# Patient Record
Sex: Female | Born: 1945
Health system: Southern US, Community
[De-identification: ages and names within clinical notes are randomized; demographics above are authoritative.]

## PROBLEM LIST (undated history)

## (undated) DIAGNOSIS — I10 Essential (primary) hypertension: Secondary | ICD-10-CM

## (undated) DIAGNOSIS — T7840XA Allergy, unspecified, initial encounter: Secondary | ICD-10-CM

## (undated) DIAGNOSIS — M1990 Unspecified osteoarthritis, unspecified site: Secondary | ICD-10-CM

## (undated) HISTORY — PX: BREAST LUMPECTOMY: SHX2

## (undated) HISTORY — PX: TUBAL LIGATION: SHX77

## (undated) HISTORY — PX: HERNIA REPAIR: SHX51

## (undated) HISTORY — DX: Essential (primary) hypertension: I10

## (undated) HISTORY — DX: Allergy, unspecified, initial encounter: T78.40XA

## (undated) HISTORY — DX: Unspecified osteoarthritis, unspecified site: M19.90

---

## 1996-07-07 HISTORY — PX: OOPHORECTOMY: SHX86

## 1997-10-23 ENCOUNTER — Ambulatory Visit (HOSPITAL_COMMUNITY): Admission: RE | Admit: 1997-10-23 | Discharge: 1997-10-23 | Payer: Self-pay | Admitting: Family Medicine

## 1999-03-17 ENCOUNTER — Other Ambulatory Visit: Admission: RE | Admit: 1999-03-17 | Discharge: 1999-03-17 | Payer: Self-pay | Admitting: Obstetrics and Gynecology

## 1999-06-19 ENCOUNTER — Other Ambulatory Visit: Admission: RE | Admit: 1999-06-19 | Discharge: 1999-06-19 | Payer: Self-pay | Admitting: Obstetrics and Gynecology

## 1999-06-25 ENCOUNTER — Encounter: Payer: Self-pay | Admitting: Obstetrics and Gynecology

## 1999-06-25 ENCOUNTER — Ambulatory Visit (HOSPITAL_COMMUNITY): Admission: RE | Admit: 1999-06-25 | Discharge: 1999-06-25 | Payer: Self-pay | Admitting: Obstetrics and Gynecology

## 1999-08-20 ENCOUNTER — Encounter (INDEPENDENT_AMBULATORY_CARE_PROVIDER_SITE_OTHER): Payer: Self-pay

## 1999-08-20 ENCOUNTER — Encounter (INDEPENDENT_AMBULATORY_CARE_PROVIDER_SITE_OTHER): Payer: Self-pay | Admitting: Specialist

## 1999-08-20 ENCOUNTER — Ambulatory Visit (HOSPITAL_COMMUNITY): Admission: RE | Admit: 1999-08-20 | Discharge: 1999-08-20 | Payer: Self-pay | Admitting: Obstetrics and Gynecology

## 2000-06-19 ENCOUNTER — Encounter (HOSPITAL_BASED_OUTPATIENT_CLINIC_OR_DEPARTMENT_OTHER): Payer: Self-pay | Admitting: General Surgery

## 2000-06-23 ENCOUNTER — Ambulatory Visit (HOSPITAL_COMMUNITY): Admission: RE | Admit: 2000-06-23 | Discharge: 2000-06-23 | Payer: Self-pay | Admitting: General Surgery

## 2000-06-23 ENCOUNTER — Encounter (INDEPENDENT_AMBULATORY_CARE_PROVIDER_SITE_OTHER): Payer: Self-pay | Admitting: *Deleted

## 2002-04-20 ENCOUNTER — Other Ambulatory Visit: Admission: RE | Admit: 2002-04-20 | Discharge: 2002-04-20 | Payer: Self-pay | Admitting: Obstetrics and Gynecology

## 2003-07-04 ENCOUNTER — Other Ambulatory Visit: Admission: RE | Admit: 2003-07-04 | Discharge: 2003-07-04 | Payer: Self-pay | Admitting: Obstetrics and Gynecology

## 2005-09-16 ENCOUNTER — Other Ambulatory Visit: Admission: RE | Admit: 2005-09-16 | Discharge: 2005-09-16 | Payer: Self-pay | Admitting: Obstetrics and Gynecology

## 2006-05-15 ENCOUNTER — Encounter: Admission: RE | Admit: 2006-05-15 | Discharge: 2006-05-15 | Payer: Self-pay | Admitting: Orthopedic Surgery

## 2006-10-06 ENCOUNTER — Encounter: Admission: RE | Admit: 2006-10-06 | Discharge: 2006-10-06 | Payer: Self-pay | Admitting: Obstetrics and Gynecology

## 2007-07-12 ENCOUNTER — Encounter: Admission: RE | Admit: 2007-07-12 | Discharge: 2007-07-12 | Payer: Self-pay | Admitting: Orthopedic Surgery

## 2007-12-09 ENCOUNTER — Ambulatory Visit (HOSPITAL_COMMUNITY): Admission: RE | Admit: 2007-12-09 | Discharge: 2007-12-09 | Payer: Self-pay | Admitting: Dermatology

## 2008-09-06 ENCOUNTER — Ambulatory Visit: Payer: Self-pay | Admitting: Gastroenterology

## 2008-09-22 ENCOUNTER — Ambulatory Visit: Payer: Self-pay | Admitting: Gastroenterology

## 2009-09-28 ENCOUNTER — Encounter: Admission: RE | Admit: 2009-09-28 | Discharge: 2009-09-28 | Payer: Self-pay | Admitting: Orthopedic Surgery

## 2009-10-24 ENCOUNTER — Encounter: Admission: RE | Admit: 2009-10-24 | Discharge: 2009-10-24 | Payer: Self-pay | Admitting: Otolaryngology

## 2010-07-28 ENCOUNTER — Encounter: Payer: Self-pay | Admitting: Family Medicine

## 2010-10-08 ENCOUNTER — Ambulatory Visit
Admission: RE | Admit: 2010-10-08 | Discharge: 2010-10-08 | Disposition: A | Discharge status: Home / Self Care | Source: Ambulatory Visit | Attending: Orthopedic Surgery | Admitting: Orthopedic Surgery

## 2010-10-08 ENCOUNTER — Other Ambulatory Visit: Payer: Self-pay | Admitting: Orthopedic Surgery

## 2010-10-08 DIAGNOSIS — M542 Cervicalgia: Secondary | ICD-10-CM

## 2010-10-16 ENCOUNTER — Other Ambulatory Visit: Payer: Self-pay | Admitting: Orthopedic Surgery

## 2010-10-16 DIAGNOSIS — M5412 Radiculopathy, cervical region: Secondary | ICD-10-CM

## 2010-10-22 ENCOUNTER — Ambulatory Visit
Admission: RE | Admit: 2010-10-22 | Discharge: 2010-10-22 | Disposition: A | Discharge status: Home / Self Care | Source: Ambulatory Visit | Attending: Orthopedic Surgery | Admitting: Orthopedic Surgery

## 2010-10-22 DIAGNOSIS — M5412 Radiculopathy, cervical region: Secondary | ICD-10-CM

## 2010-11-22 NOTE — Op Note (Signed)
[facility]  Patient:    Jill Lawrence, Jill Lawrence                     MRN: [id number] Proc. Date: 08/20/99 Adm. Date:  [id number] Attending:  Dierdre Forth Pearline                           Operative Report  PREOPERATIVE DIAGNOSES:       Uterine fibroids; left adnexal mass.  POSTOPERATIVE DIAGNOSES:      Uterine fibroids; left ovarian mass, probable cystic teratoma.  OPERATION:                    Open laparoscopy and left oophorectomy.  SURGEON:                      Vanessa P. Pennie Rushing, M.D.  FIRST ASSISTANT:              Janine Limbo, M.D.  ANESTHESIA:                   General orotracheal.  ESTIMATED BLOOD LOSS:         Less than 50 cc.  COMPLICATIONS:                None.  FINDINGS:                     The uterus was upper-limits-normal-size with a 1-cm uterine fibroid on the anterior fundus and a 3-cm uterine fibroid on the posterior fundus which were both subserosal.  The tubes were status post tubal interruption for sterilization.  The right ovary appeared normal.  The left ovary was enlarged to 5 x 4 cm and appeared primarily cystic.  There were no excrescences or other  implants of apparent disease.  DESCRIPTION OF PROCEDURE:     The patient was taken to the operating room after  appropriate identification and placed on the operating table.  After the attainment of adequate general anesthesia, she was placed in the modified lithotomy position. The abdomen, perineum and vagina were prepped with multiple layers of Betadine nd a Foley catheter placed into the bladder under sterile conditions and connected to straight drainage.  A Hulka tenaculum was placed on the anterior cervix.  The abdomen was draped as a sterile field.  The subumbilical and suprapubic areas were infiltrated with a total of 10 cc of 0.25% Marcaine.  A subumbilical incision was made in the area of infiltration and the Veress cannula placed through  that incision into the peritoneal cavity.  A pneumoperitoneum was created with 3 L of CO2.  The laparoscopic trocar was placed through that incision after removal of the Veress cannula and a laparoscope placed through the trocar sleeve.  Suprapubic incisions were made to the right and left of midline in the areas where local infiltration had been achieved.  Laparoscopic probe trocars were placed through  those incisions into the peritoneal cavity under direct visualization.  The above findings were noted and documented and the left ovary grasped by the uteroovarian ligament.  The left ureter was identified, as was the left infundibulopelvic ligament.  Three successive 0 Vicryl endoloops were placed into the peritoneal cavity via the probe trocar sleeve and tied down on the infundibulopelvic and uteroovarian ligaments.  After tying down these three sutures, the ovary was excised and placed in the anterior cul-de-sac.  Hemostasis was noted  to be adequate immediately and after release of the pneumoperitoneum.  The ovary was then removed from the peritoneal cavity with the aid of an Endobag.  The subumbilical incision had to be extended in order to allow removal of the ovary from that site in the  Endobag.  Once the ovary was removed, the laparoscope was replaced and the pelvis again evaluated and hemostasis still noted to be adequate.  Copious irrigation as carried out with the Nezhat cannula and all instruments were removed from the peritoneal cavity under direct visualization as the CO2 was allowed to escape.  Fascial sutures were placed in the subumbilical incision of 0 Vicryl in a figure-of-eight fashion.  The subcutaneous tissue was reapproximated with interrupted sutures and a subcuticular suture of 3-0 Vicryl used to close the skin incision.  The suprapubic skin incisions were closed with subcuticular sutures f 3-0 Vicryl.  Prior to removal of the ovary, copious irrigation  of the pelvis was carried out and pelvis washings obtained via the Nezhat cannula.  These were sent under separate pathologic specimen.  Once the incisions had been closed, sterile dressings were applied and the Hulka tenaculum removed.  The patient was then awakened from general anesthesia and taken to the recovery room in satisfactory  condition, having tolerated the procedure well, with sponge and instrument counts correct.  SPECIMENS TO PATHOLOGY:       Left ovary and peritoneal washings. DD:  08/20/99 TD:  08/20/99 Job: [id number]

## 2010-11-22 NOTE — Op Note (Signed)
[place]  Patient:    Jill Lawrence, Jill Lawrence                     MRN: [id number] Proc. Date: 06/23/00 Adm. Date:  [id number] Disc. Date: [id number] Attending:  Sonda Primes CC:         Sharyn Dross., M.D.                           Operative Report  PREOPERATIVE DIAGNOSIS:  Ventral hernia.  POSTOPERATIVE DIAGNOSIS:  Ventral hernia.  PROCEDURE:  Repair of ventral hernia with mesh.  SURGEON:  Mardene Celeste. Lurene Shadow, M.D.  ASSISTANT:  Nurse.  ANESTHESIA:  General.  INDICATIONS:  This patient is a 65 year old woman presenting with severe abdominal pain due to an incarcerating ventral hernia, which was located just inferior to the umbilicus.  She was prepared now and brought to the operating room for repair.  PROCEDURE:  Following the induction of satisfactory anesthesia with the patient positioned supinely, the abdomen was prepped and draped to be included in a sterile operative field.  A midline incision was made through the umbilicus carrying the incision down below the umbilicus for approximately 1.5 inches.  Deepened through the skin and subcutaneous tissues and dissection carried down to the hernial sac.  Sac was dissected free from the surrounding tissues and opened.  On palpation on the inside, there was another small hernia towards superiorly cephalad towards the umbilicus.  Hernia sac was removed.  A mesh plug was placed in the defect and sewn in place with a running suture of #1 Novofil.  Over this, a patch was fashioned so as to cover both the superior and inferior defects and this was also sewn with a running suture of #1 Novofil.  Wound was then thoroughly irrigated with saline.  All areas of dissection were checked for hemostasis and noted to be dry.  Sponge, instrument and sharp counts were verified.  Subcutaneous tissues were closed with running 2-0 Vicryl suture, skin closed with running 4-0 Vicryl and then reinforced with  Steri-Strips.  Sterile dressings applied.  Anesthetic reversed and the patient removed from the operating room to the recovery room in stable condition having tolerated the procedure well. DD:  06/23/00 TD:  06/24/00 Job: [id number]

## 2011-09-06 IMAGING — CR DG KNEE 1-2V*R*
2 series · 2 of 2 positions shown · non-contrast
Comparison: Right knee films of 07/12/2007

CLINICAL DATA: Knee pain, left greater than right

RIGHT KNEE - 1-2 VIEW

[w knee ap right]
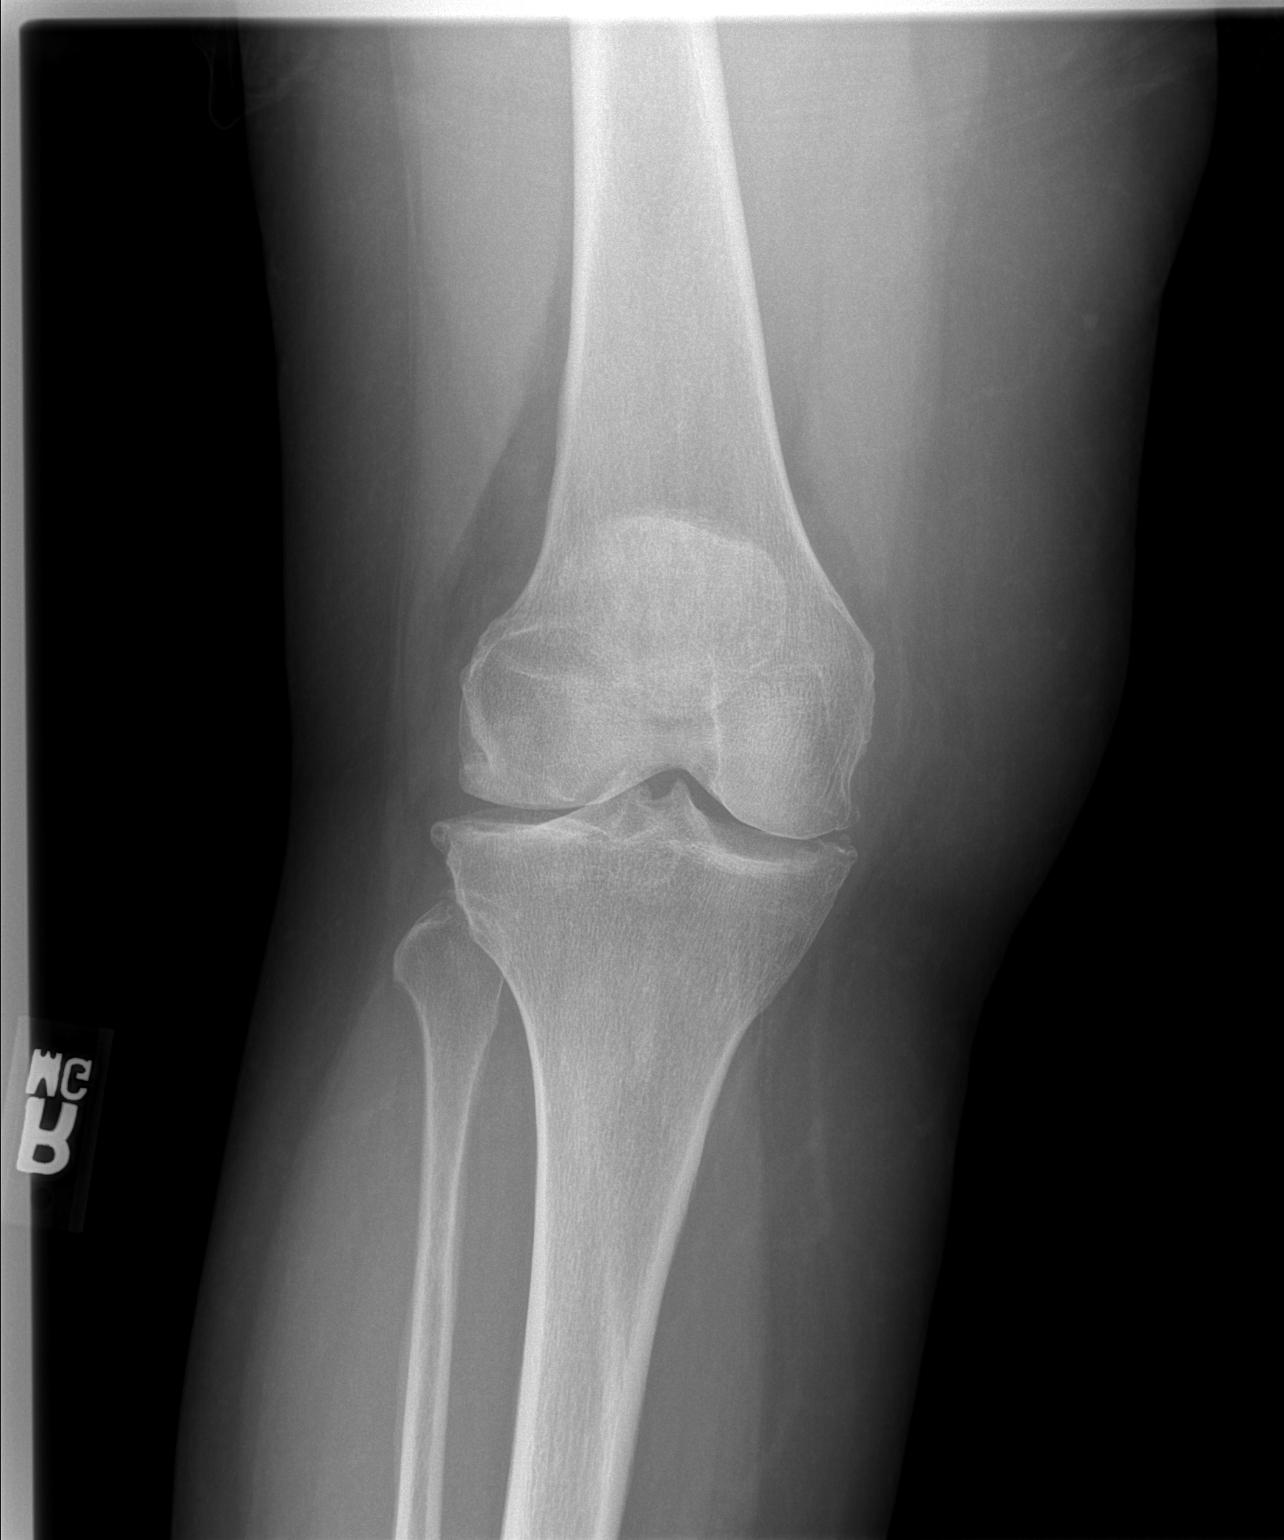

[w knee lat. right]
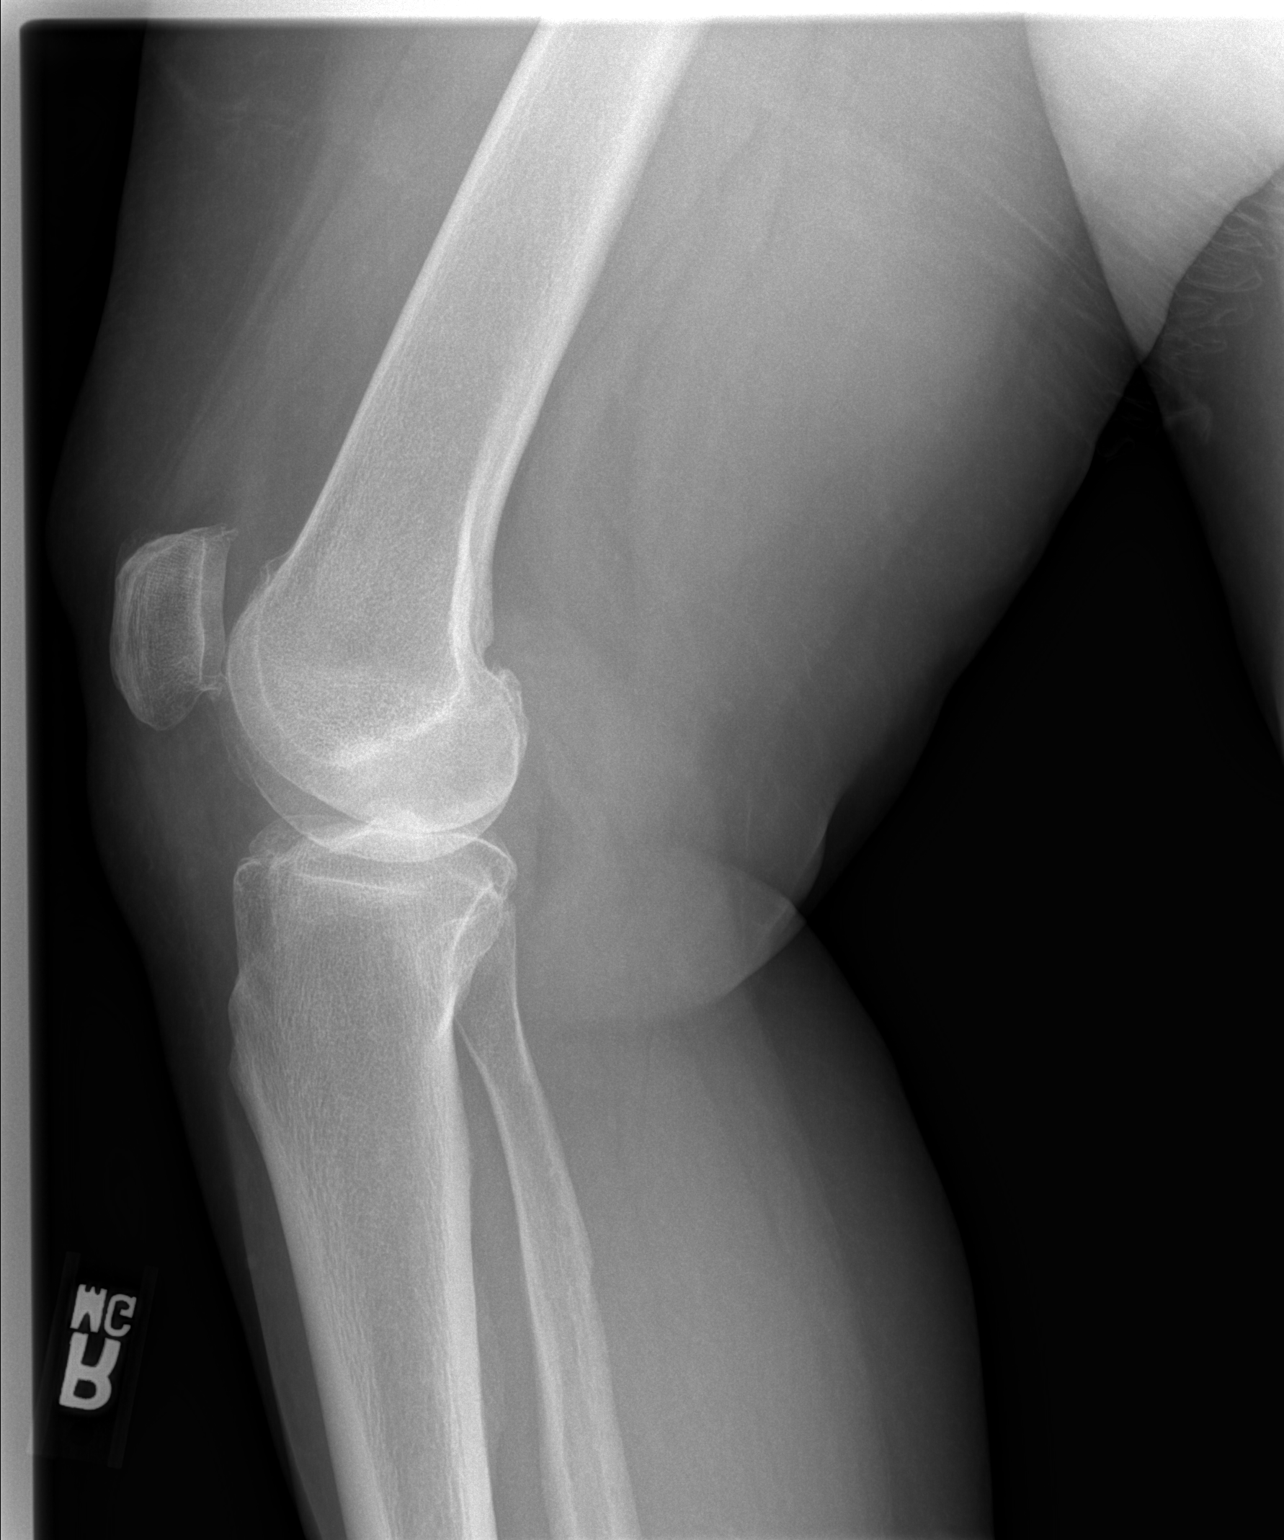

[2 of 2 positions shown; findings below may reference images not displayed]

FINDINGS: Mild degenerative joint disease is noted diffusely.  No
fracture is seen and no joint effusion is noted.
IMPRESSION: Mild diffuse degenerative joint disease.

## 2011-09-06 IMAGING — CR DG KNEE 1-2V*L*
2 series · 2 of 2 positions shown · non-contrast
Comparison: Left knee films of 07/12/2007

CLINICAL DATA: Knee pain, left greater than right

LEFT KNEE - 1-2 VIEW

[w knee ap left]
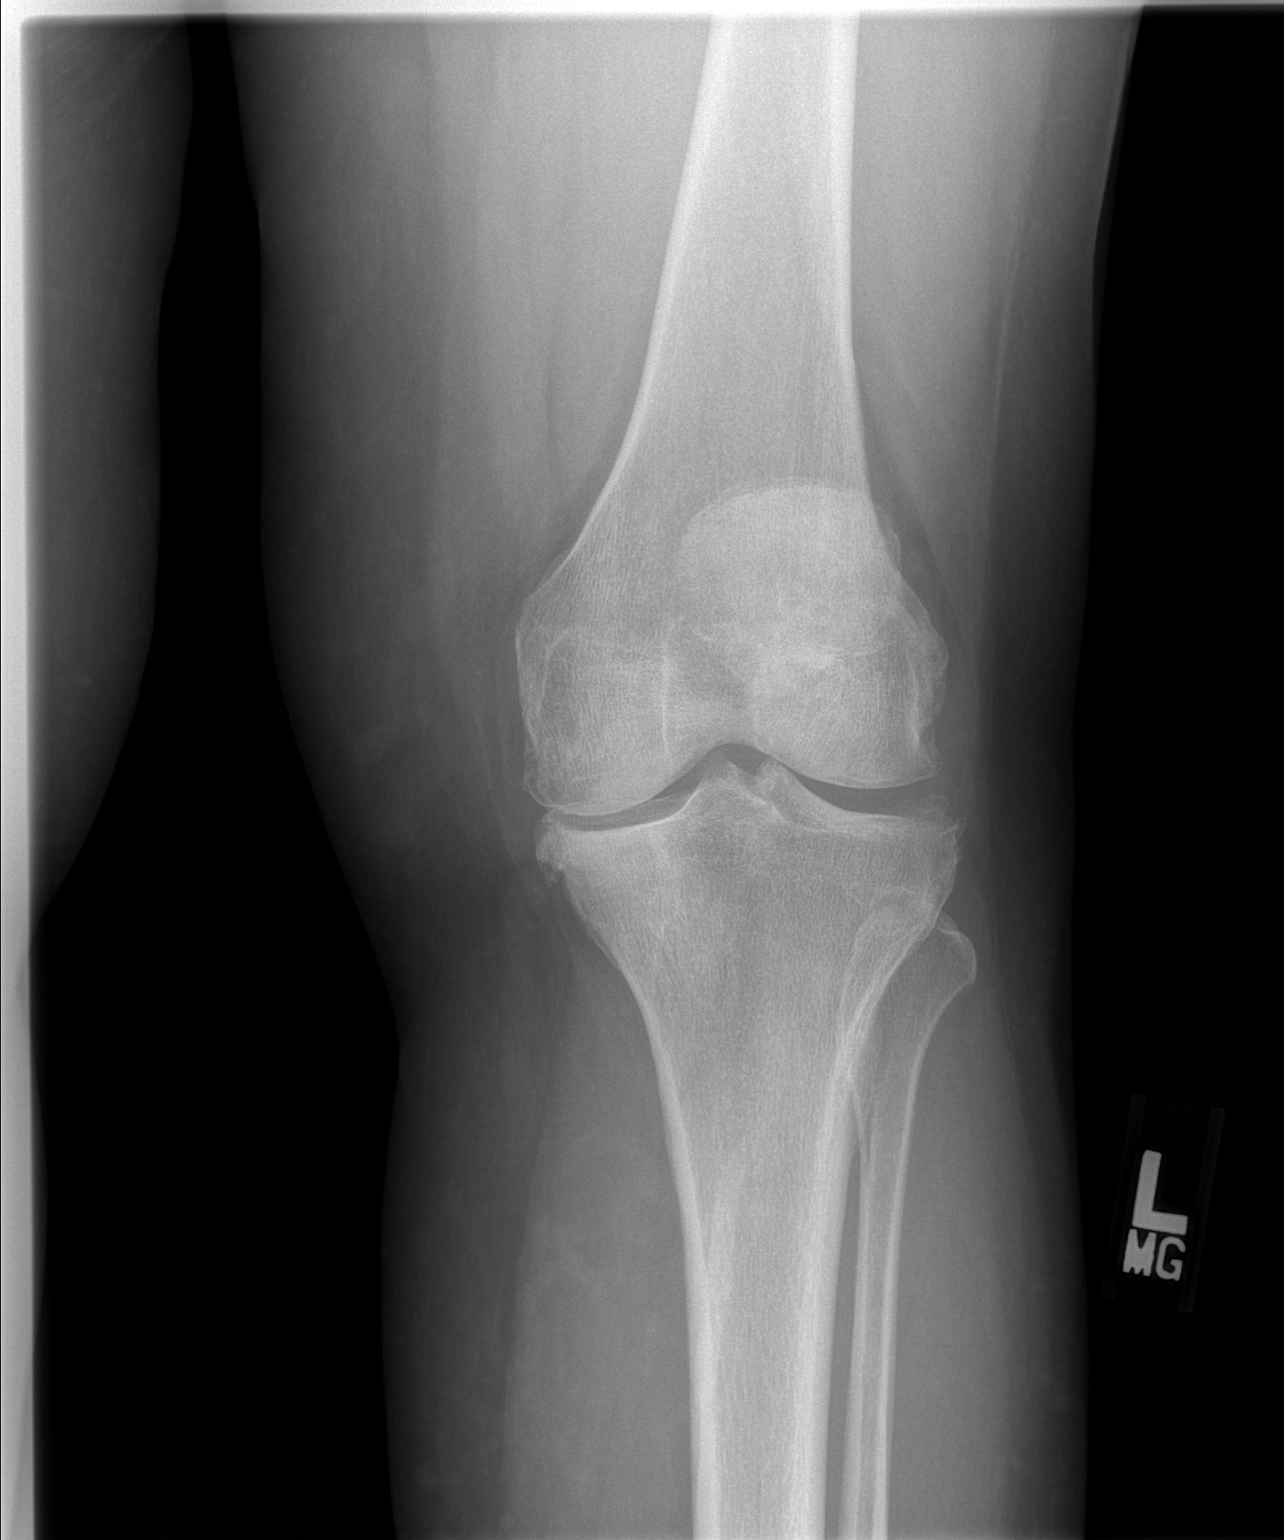

[w knee lat. left]
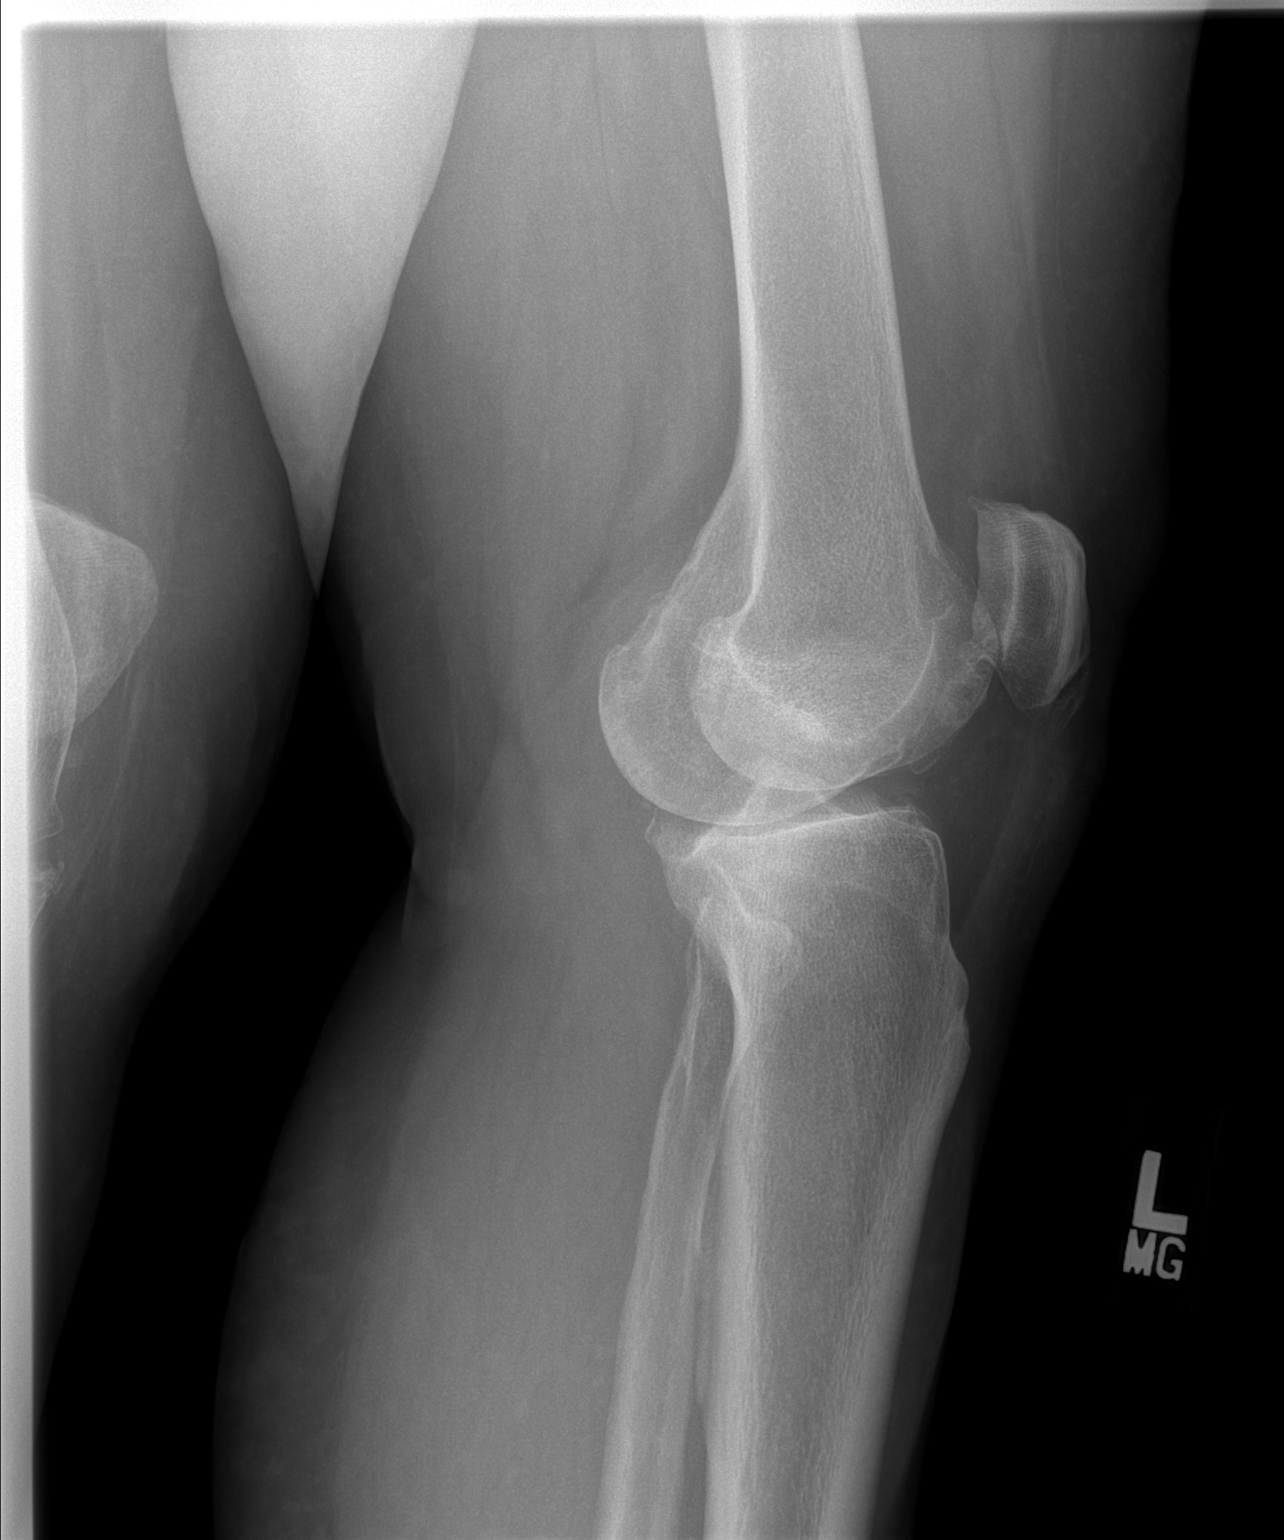

[2 of 2 positions shown; findings below may reference images not displayed]

FINDINGS: Degenerative joint disease primarily medially appears
relatively stable with loss of joint space, sclerosis, and
spurring.  No fracture is seen and no joint space effusion is
noted.
IMPRESSION: Relatively stable degenerative joint disease primarily involving
the medial compartment.

## 2011-10-14 ENCOUNTER — Ambulatory Visit: Payer: Self-pay | Admitting: Obstetrics and Gynecology

## 2011-11-18 ENCOUNTER — Encounter: Payer: Self-pay | Admitting: Obstetrics and Gynecology

## 2011-11-18 ENCOUNTER — Ambulatory Visit

## 2011-11-18 ENCOUNTER — Ambulatory Visit (INDEPENDENT_AMBULATORY_CARE_PROVIDER_SITE_OTHER): Admitting: Obstetrics and Gynecology

## 2011-11-18 VITALS — BP 122/78 | Ht 64.0 in | Wt 177.0 lb

## 2011-11-18 DIAGNOSIS — M949 Disorder of cartilage, unspecified: Secondary | ICD-10-CM

## 2011-11-18 DIAGNOSIS — N951 Menopausal and female climacteric states: Secondary | ICD-10-CM

## 2011-11-18 DIAGNOSIS — Z124 Encounter for screening for malignant neoplasm of cervix: Secondary | ICD-10-CM

## 2011-11-18 DIAGNOSIS — Z13820 Encounter for screening for osteoporosis: Secondary | ICD-10-CM

## 2011-11-18 DIAGNOSIS — I10 Essential (primary) hypertension: Secondary | ICD-10-CM

## 2011-11-18 DIAGNOSIS — M8580 Other specified disorders of bone density and structure, unspecified site: Secondary | ICD-10-CM

## 2011-11-18 NOTE — Progress Notes (Signed)
The patient is not taking hormone replacement therapy The patient  is taking a Calcium supplement. Post-menopausal bleeding:no  Last Pap: was normal March  2010 Last mammogram: was normal December  2012 Last DEXA scan : T= -0.07 September 2008 Last colonoscopy:was normal March 2010  Urinary symptoms: none Normal bowel movements: Yes Reports abuse at home: No:   Subjective:    Jill Lawrence is a 66 y.o. female . who presents for annual exam.  The patient has no complaints today. Her husband is being treated for stage 4 colon cancer with alternative therapy in the [place]. The following portions of the patient's history were reviewed and updated as appropriate: allergies, current medications, past family history, past medical history, past social history, past surgical history and problem list.  Review of Systems Pertinent items are noted in HPI. Gastrointestinal:No change in bowel habits, no abdominal pain, no rectal bleeding Genitourinary:negative for dysuria, frequency, hematuria, nocturia and urinary incontinence    Objective:     BP 122/78  Ht 5\' 4"  (1.626 m)  Wt 177 lb (80.287 kg)  BMI 30.38 kg/m2  Weight:  Wt Readings from Last 1 Encounters:  11/18/11 177 lb (80.287 kg)     BMI: Body mass index is 30.38 kg/(m^2). General Appearance: Alert, appropriate appearance for age. No acute distress HEENT: Grossly normal Neck / Thyroid: Supple, no masses, nodes or enlargement Lungs: clear to auscultation bilaterally Back: No CVA tenderness Breast Exam: No masses or nodes.No dimpling, nipple retraction or discharge. Cardiovascular: Regular rate and rhythm. S1, S2, no murmur Gastrointestinal: Soft, non-tender, no masses or organomegaly Pelvic Exam: External genitalia: normal general appearance Vaginal: normal mucosa without prolapse or lesions Cervix: normal appearance Adnexa: non palpable Uterus: upper limits normal size though the examination is slightly compromised by  patient's habitus Rectovaginal: normal rectal, no masses Lymphatic Exam: Non-palpable nodes in neck, clavicular, axillary, or inguinal regions Skin: no rash or abnormalities Neurologic: Normal gait and speech, no tremor  Psychiatric: Alert and oriented, appropriate affect.    Urinalysis:Not done      Assessment:    Menopause No symptoms Status post left oophorectomy for ovarian dermoid   Plan:    Pap with high-risk HPV  Bone density Mammogram Follow-up:  for annual exam

## 2011-11-19 LAB — VITAMIN D 25 HYDROXY (VIT D DEFICIENCY, FRACTURES): Vit D, 25-Hydroxy: 59 ng/mL (ref 30–89)

## 2011-11-21 LAB — PAP IG AND HPV HIGH-RISK

## 2012-05-17 DIAGNOSIS — M659 Synovitis and tenosynovitis, unspecified: Secondary | ICD-10-CM | POA: Diagnosis not present

## 2012-05-17 DIAGNOSIS — M1710 Unilateral primary osteoarthritis, unspecified knee: Secondary | ICD-10-CM | POA: Diagnosis not present

## 2012-06-09 DIAGNOSIS — E119 Type 2 diabetes mellitus without complications: Secondary | ICD-10-CM | POA: Diagnosis not present

## 2012-06-09 DIAGNOSIS — E7800 Pure hypercholesterolemia, unspecified: Secondary | ICD-10-CM | POA: Diagnosis not present

## 2012-06-09 DIAGNOSIS — E781 Pure hyperglyceridemia: Secondary | ICD-10-CM | POA: Diagnosis not present

## 2012-06-09 DIAGNOSIS — Z23 Encounter for immunization: Secondary | ICD-10-CM | POA: Diagnosis not present

## 2012-06-09 DIAGNOSIS — I10 Essential (primary) hypertension: Secondary | ICD-10-CM | POA: Diagnosis not present

## 2012-07-21 DIAGNOSIS — Z1231 Encounter for screening mammogram for malignant neoplasm of breast: Secondary | ICD-10-CM | POA: Diagnosis not present

## 2012-07-23 DIAGNOSIS — H524 Presbyopia: Secondary | ICD-10-CM | POA: Diagnosis not present

## 2012-07-23 DIAGNOSIS — H04159 Secondary lacrimal gland atrophy, unspecified lacrimal gland: Secondary | ICD-10-CM | POA: Diagnosis not present

## 2012-07-30 DIAGNOSIS — Z09 Encounter for follow-up examination after completed treatment for conditions other than malignant neoplasm: Secondary | ICD-10-CM | POA: Diagnosis not present

## 2012-07-30 DIAGNOSIS — R928 Other abnormal and inconclusive findings on diagnostic imaging of breast: Secondary | ICD-10-CM | POA: Diagnosis not present

## 2012-08-04 ENCOUNTER — Encounter: Payer: Self-pay | Admitting: Obstetrics and Gynecology

## 2012-08-04 DIAGNOSIS — R921 Mammographic calcification found on diagnostic imaging of breast: Secondary | ICD-10-CM | POA: Insufficient documentation

## 2012-12-15 DIAGNOSIS — M5430 Sciatica, unspecified side: Secondary | ICD-10-CM | POA: Diagnosis not present

## 2012-12-29 DIAGNOSIS — M5430 Sciatica, unspecified side: Secondary | ICD-10-CM | POA: Diagnosis not present

## 2012-12-30 ENCOUNTER — Ambulatory Visit
Admission: RE | Admit: 2012-12-30 | Discharge: 2012-12-30 | Disposition: A | Discharge status: Home / Self Care | Payer: Medicare Other | Source: Ambulatory Visit | Attending: Orthopedic Surgery | Admitting: Orthopedic Surgery

## 2012-12-30 ENCOUNTER — Other Ambulatory Visit: Payer: Self-pay | Admitting: Orthopedic Surgery

## 2012-12-30 DIAGNOSIS — M47817 Spondylosis without myelopathy or radiculopathy, lumbosacral region: Secondary | ICD-10-CM | POA: Diagnosis not present

## 2012-12-30 DIAGNOSIS — M4120 Other idiopathic scoliosis, site unspecified: Secondary | ICD-10-CM | POA: Diagnosis not present

## 2012-12-30 DIAGNOSIS — M5430 Sciatica, unspecified side: Secondary | ICD-10-CM

## 2013-01-17 DIAGNOSIS — M5430 Sciatica, unspecified side: Secondary | ICD-10-CM | POA: Diagnosis not present

## 2013-01-17 DIAGNOSIS — M999 Biomechanical lesion, unspecified: Secondary | ICD-10-CM | POA: Diagnosis not present

## 2013-01-19 DIAGNOSIS — M999 Biomechanical lesion, unspecified: Secondary | ICD-10-CM | POA: Diagnosis not present

## 2013-01-19 DIAGNOSIS — M5430 Sciatica, unspecified side: Secondary | ICD-10-CM | POA: Diagnosis not present

## 2013-01-24 DIAGNOSIS — M5430 Sciatica, unspecified side: Secondary | ICD-10-CM | POA: Diagnosis not present

## 2013-01-24 DIAGNOSIS — M999 Biomechanical lesion, unspecified: Secondary | ICD-10-CM | POA: Diagnosis not present

## 2013-01-26 DIAGNOSIS — M5430 Sciatica, unspecified side: Secondary | ICD-10-CM | POA: Diagnosis not present

## 2013-01-26 DIAGNOSIS — M999 Biomechanical lesion, unspecified: Secondary | ICD-10-CM | POA: Diagnosis not present

## 2013-02-02 DIAGNOSIS — R928 Other abnormal and inconclusive findings on diagnostic imaging of breast: Secondary | ICD-10-CM | POA: Diagnosis not present

## 2013-02-03 DIAGNOSIS — M999 Biomechanical lesion, unspecified: Secondary | ICD-10-CM | POA: Diagnosis not present

## 2013-02-03 DIAGNOSIS — M5430 Sciatica, unspecified side: Secondary | ICD-10-CM | POA: Diagnosis not present

## 2013-02-07 DIAGNOSIS — M999 Biomechanical lesion, unspecified: Secondary | ICD-10-CM | POA: Diagnosis not present

## 2013-02-07 DIAGNOSIS — M5430 Sciatica, unspecified side: Secondary | ICD-10-CM | POA: Diagnosis not present

## 2013-02-09 DIAGNOSIS — M5430 Sciatica, unspecified side: Secondary | ICD-10-CM | POA: Diagnosis not present

## 2013-02-09 DIAGNOSIS — M999 Biomechanical lesion, unspecified: Secondary | ICD-10-CM | POA: Diagnosis not present

## 2013-02-14 DIAGNOSIS — M5430 Sciatica, unspecified side: Secondary | ICD-10-CM | POA: Diagnosis not present

## 2013-02-14 DIAGNOSIS — M999 Biomechanical lesion, unspecified: Secondary | ICD-10-CM | POA: Diagnosis not present

## 2013-03-29 ENCOUNTER — Other Ambulatory Visit: Payer: Self-pay | Admitting: Orthopedic Surgery

## 2013-03-29 DIAGNOSIS — M545 Low back pain: Secondary | ICD-10-CM

## 2013-03-29 DIAGNOSIS — M5430 Sciatica, unspecified side: Secondary | ICD-10-CM | POA: Diagnosis not present

## 2013-04-05 ENCOUNTER — Ambulatory Visit
Admission: RE | Admit: 2013-04-05 | Discharge: 2013-04-05 | Disposition: A | Discharge status: Home / Self Care | Payer: Medicare Other | Source: Ambulatory Visit | Attending: Orthopedic Surgery | Admitting: Orthopedic Surgery

## 2013-04-05 DIAGNOSIS — M5126 Other intervertebral disc displacement, lumbar region: Secondary | ICD-10-CM | POA: Diagnosis not present

## 2013-04-05 DIAGNOSIS — M545 Low back pain: Secondary | ICD-10-CM

## 2013-04-05 DIAGNOSIS — M48061 Spinal stenosis, lumbar region without neurogenic claudication: Secondary | ICD-10-CM | POA: Diagnosis not present

## 2013-04-13 DIAGNOSIS — M5126 Other intervertebral disc displacement, lumbar region: Secondary | ICD-10-CM | POA: Diagnosis not present

## 2013-04-27 ENCOUNTER — Other Ambulatory Visit: Payer: Self-pay | Admitting: Orthopedic Surgery

## 2013-04-27 DIAGNOSIS — M549 Dorsalgia, unspecified: Secondary | ICD-10-CM

## 2013-05-04 ENCOUNTER — Ambulatory Visit
Admission: RE | Admit: 2013-05-04 | Discharge: 2013-05-04 | Disposition: A | Discharge status: Home / Self Care | Payer: Medicare Other | Source: Ambulatory Visit | Attending: Orthopedic Surgery | Admitting: Orthopedic Surgery

## 2013-05-04 DIAGNOSIS — M549 Dorsalgia, unspecified: Secondary | ICD-10-CM

## 2013-05-04 DIAGNOSIS — M5126 Other intervertebral disc displacement, lumbar region: Secondary | ICD-10-CM | POA: Diagnosis not present

## 2013-05-04 DIAGNOSIS — M47817 Spondylosis without myelopathy or radiculopathy, lumbosacral region: Secondary | ICD-10-CM | POA: Diagnosis not present

## 2013-05-04 MED ORDER — IOHEXOL 180 MG/ML  SOLN
1.0000 mL | Freq: Once | INTRAMUSCULAR | Status: AC | PRN
  Administered 2013-05-04: 1 mL via EPIDURAL

## 2013-05-04 MED ORDER — METHYLPREDNISOLONE ACETATE 40 MG/ML INJ SUSP (RADIOLOG
120.0000 mg | Freq: Once | INTRAMUSCULAR | Status: AC
  Administered 2013-05-04: 120 mg via EPIDURAL

## 2013-05-16 DIAGNOSIS — M5430 Sciatica, unspecified side: Secondary | ICD-10-CM | POA: Diagnosis not present

## 2013-06-10 DIAGNOSIS — J209 Acute bronchitis, unspecified: Secondary | ICD-10-CM | POA: Diagnosis not present

## 2013-06-10 DIAGNOSIS — E119 Type 2 diabetes mellitus without complications: Secondary | ICD-10-CM | POA: Diagnosis not present

## 2013-06-10 DIAGNOSIS — E781 Pure hyperglyceridemia: Secondary | ICD-10-CM | POA: Diagnosis not present

## 2013-06-20 DIAGNOSIS — J069 Acute upper respiratory infection, unspecified: Secondary | ICD-10-CM | POA: Diagnosis not present

## 2013-07-27 DIAGNOSIS — H04159 Secondary lacrimal gland atrophy, unspecified lacrimal gland: Secondary | ICD-10-CM | POA: Diagnosis not present

## 2013-08-02 ENCOUNTER — Encounter: Payer: Self-pay | Admitting: Gastroenterology

## 2013-08-03 DIAGNOSIS — R928 Other abnormal and inconclusive findings on diagnostic imaging of breast: Secondary | ICD-10-CM | POA: Diagnosis not present

## 2013-11-09 DIAGNOSIS — E119 Type 2 diabetes mellitus without complications: Secondary | ICD-10-CM | POA: Diagnosis not present

## 2013-11-09 DIAGNOSIS — E781 Pure hyperglyceridemia: Secondary | ICD-10-CM | POA: Diagnosis not present

## 2013-11-09 DIAGNOSIS — I10 Essential (primary) hypertension: Secondary | ICD-10-CM | POA: Diagnosis not present

## 2013-11-09 DIAGNOSIS — E7800 Pure hypercholesterolemia, unspecified: Secondary | ICD-10-CM | POA: Diagnosis not present

## 2013-11-22 DIAGNOSIS — Z119 Encounter for screening for infectious and parasitic diseases, unspecified: Secondary | ICD-10-CM | POA: Diagnosis not present

## 2013-11-22 DIAGNOSIS — Z01419 Encounter for gynecological examination (general) (routine) without abnormal findings: Secondary | ICD-10-CM | POA: Diagnosis not present

## 2014-01-18 DIAGNOSIS — M25819 Other specified joint disorders, unspecified shoulder: Secondary | ICD-10-CM | POA: Diagnosis not present

## 2014-03-01 DIAGNOSIS — Z09 Encounter for follow-up examination after completed treatment for conditions other than malignant neoplasm: Secondary | ICD-10-CM | POA: Diagnosis not present

## 2014-03-01 DIAGNOSIS — R928 Other abnormal and inconclusive findings on diagnostic imaging of breast: Secondary | ICD-10-CM | POA: Diagnosis not present

## 2014-03-09 DIAGNOSIS — H8309 Labyrinthitis, unspecified ear: Secondary | ICD-10-CM | POA: Diagnosis not present

## 2014-03-22 DIAGNOSIS — E119 Type 2 diabetes mellitus without complications: Secondary | ICD-10-CM | POA: Diagnosis not present

## 2014-03-22 DIAGNOSIS — H8309 Labyrinthitis, unspecified ear: Secondary | ICD-10-CM | POA: Diagnosis not present

## 2014-03-22 DIAGNOSIS — E781 Pure hyperglyceridemia: Secondary | ICD-10-CM | POA: Diagnosis not present

## 2014-03-23 ENCOUNTER — Encounter: Payer: Self-pay | Admitting: Gastroenterology

## 2014-04-18 ENCOUNTER — Encounter: Payer: Self-pay | Admitting: Gastroenterology

## 2014-06-20 DIAGNOSIS — S93402A Sprain of unspecified ligament of left ankle, initial encounter: Secondary | ICD-10-CM | POA: Diagnosis not present

## 2014-10-03 DIAGNOSIS — I10 Essential (primary) hypertension: Secondary | ICD-10-CM | POA: Diagnosis not present

## 2014-10-03 DIAGNOSIS — R7309 Other abnormal glucose: Secondary | ICD-10-CM | POA: Diagnosis not present

## 2014-10-04 DIAGNOSIS — R920 Mammographic microcalcification found on diagnostic imaging of breast: Secondary | ICD-10-CM | POA: Diagnosis not present

## 2014-12-08 IMAGING — CR DG LUMBAR SPINE COMPLETE 4+V
5 series · 5 of 5 positions shown · non-contrast
Comparison: None.

CLINICAL DATA: Low back and left leg pain

LUMBAR SPINE - COMPLETE 4+ VIEW

[t l-spine a.p.]
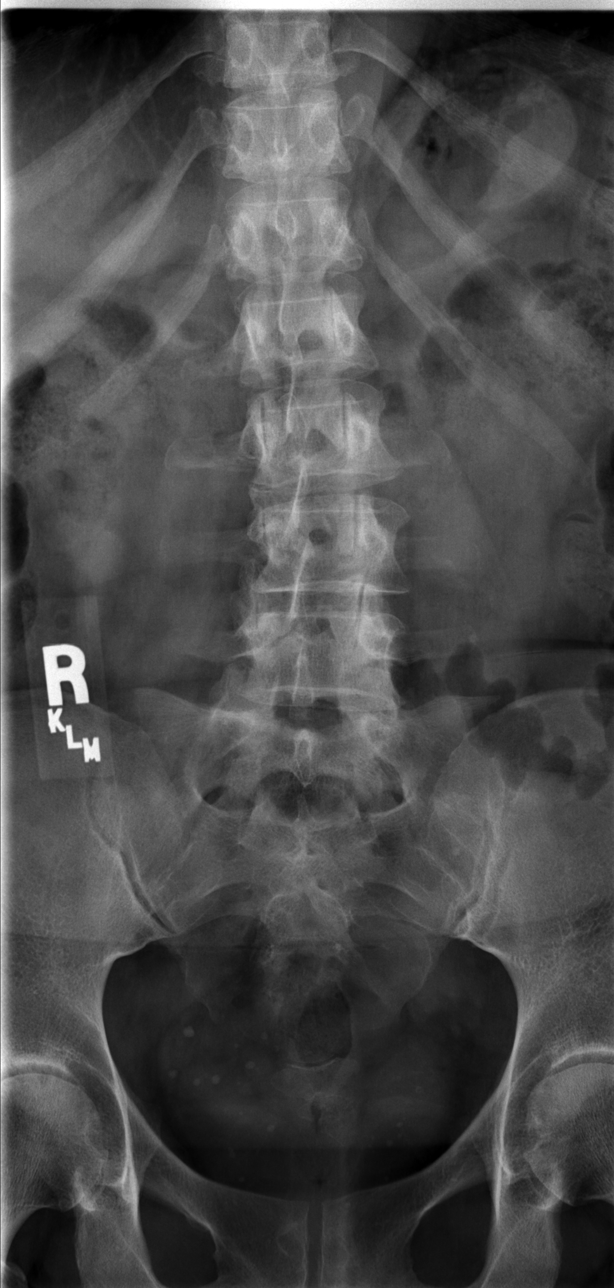

[t l-spine oblique exposure (1 of 2)]
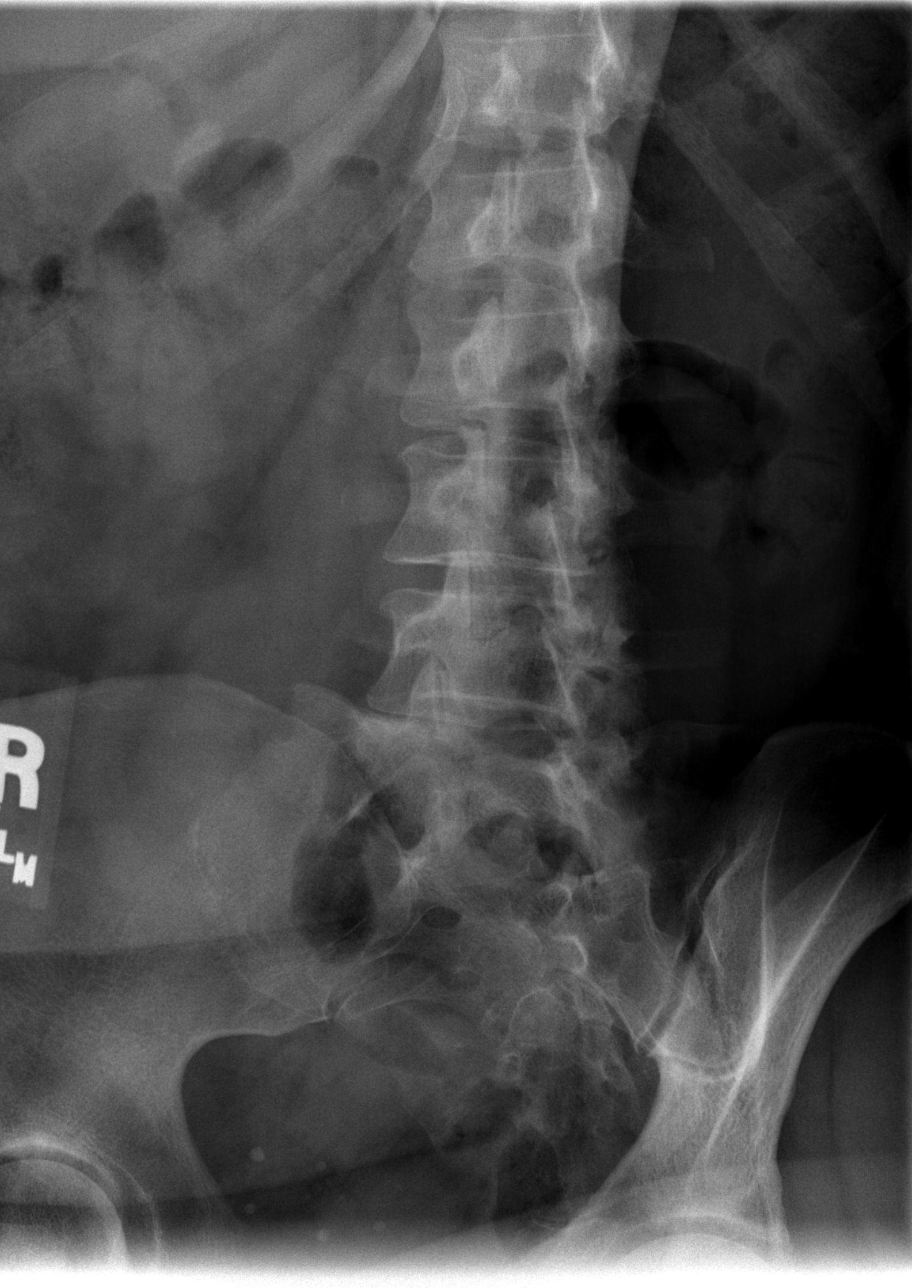

[t l-spine oblique exposure (2 of 2)]
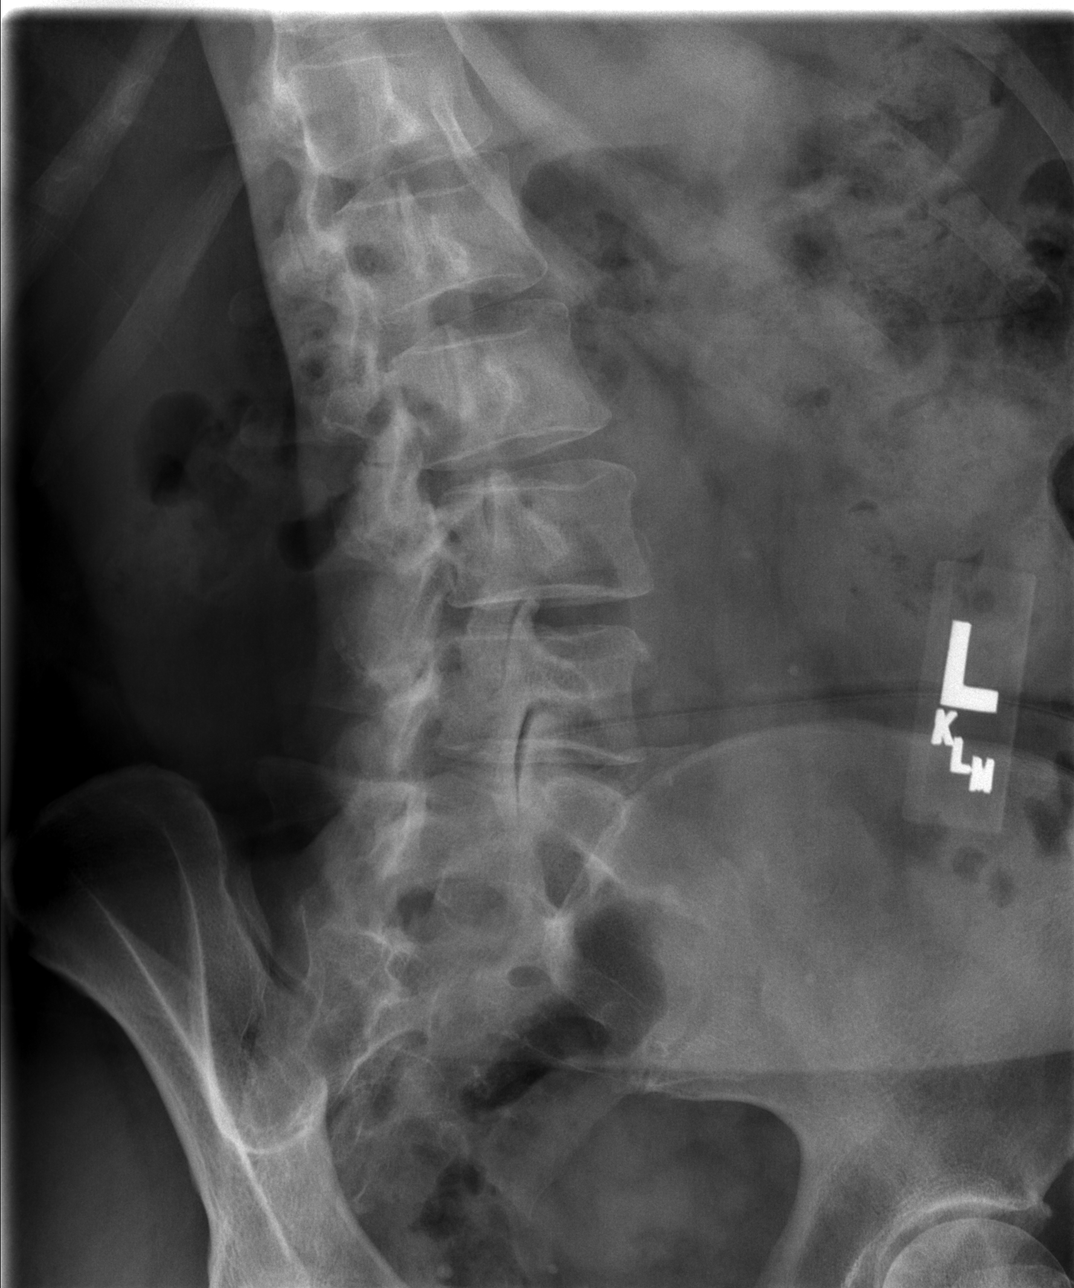

[t l-spine lat]
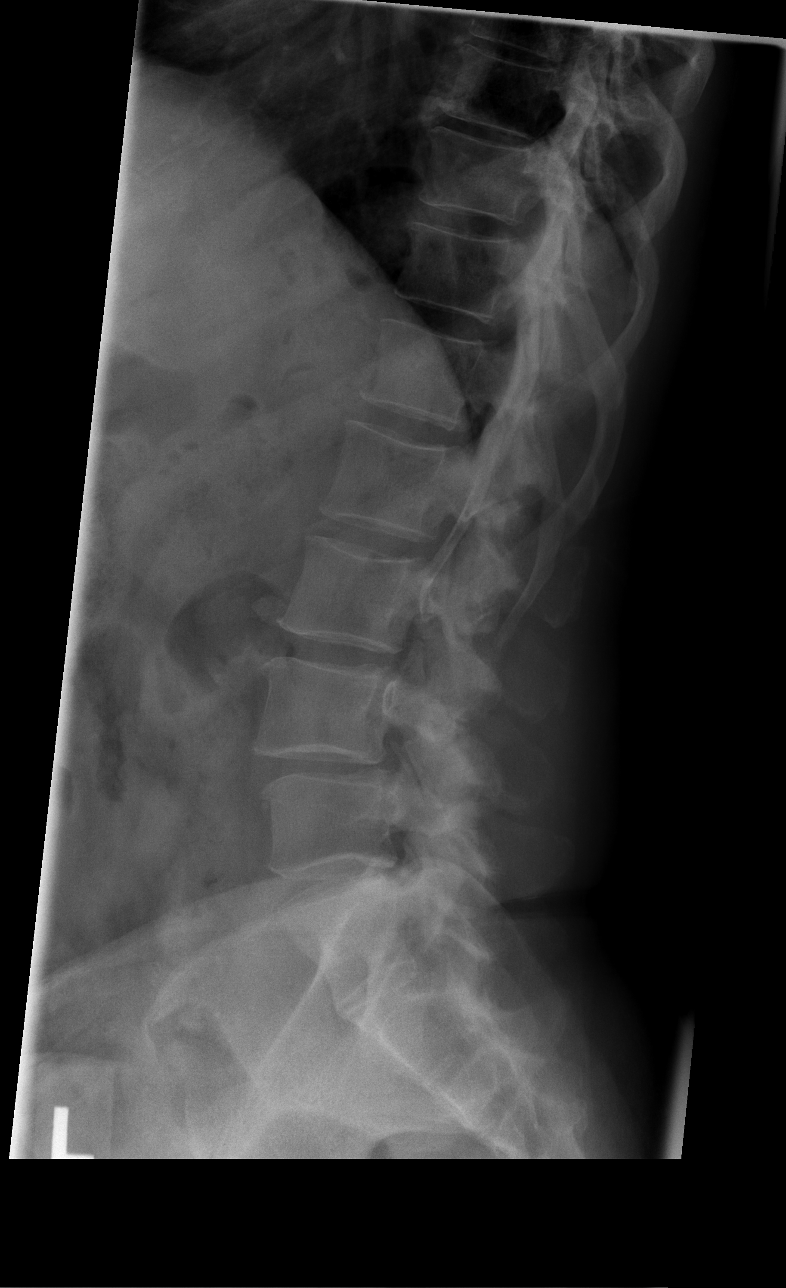

[t l-spine l5-s1 spot]
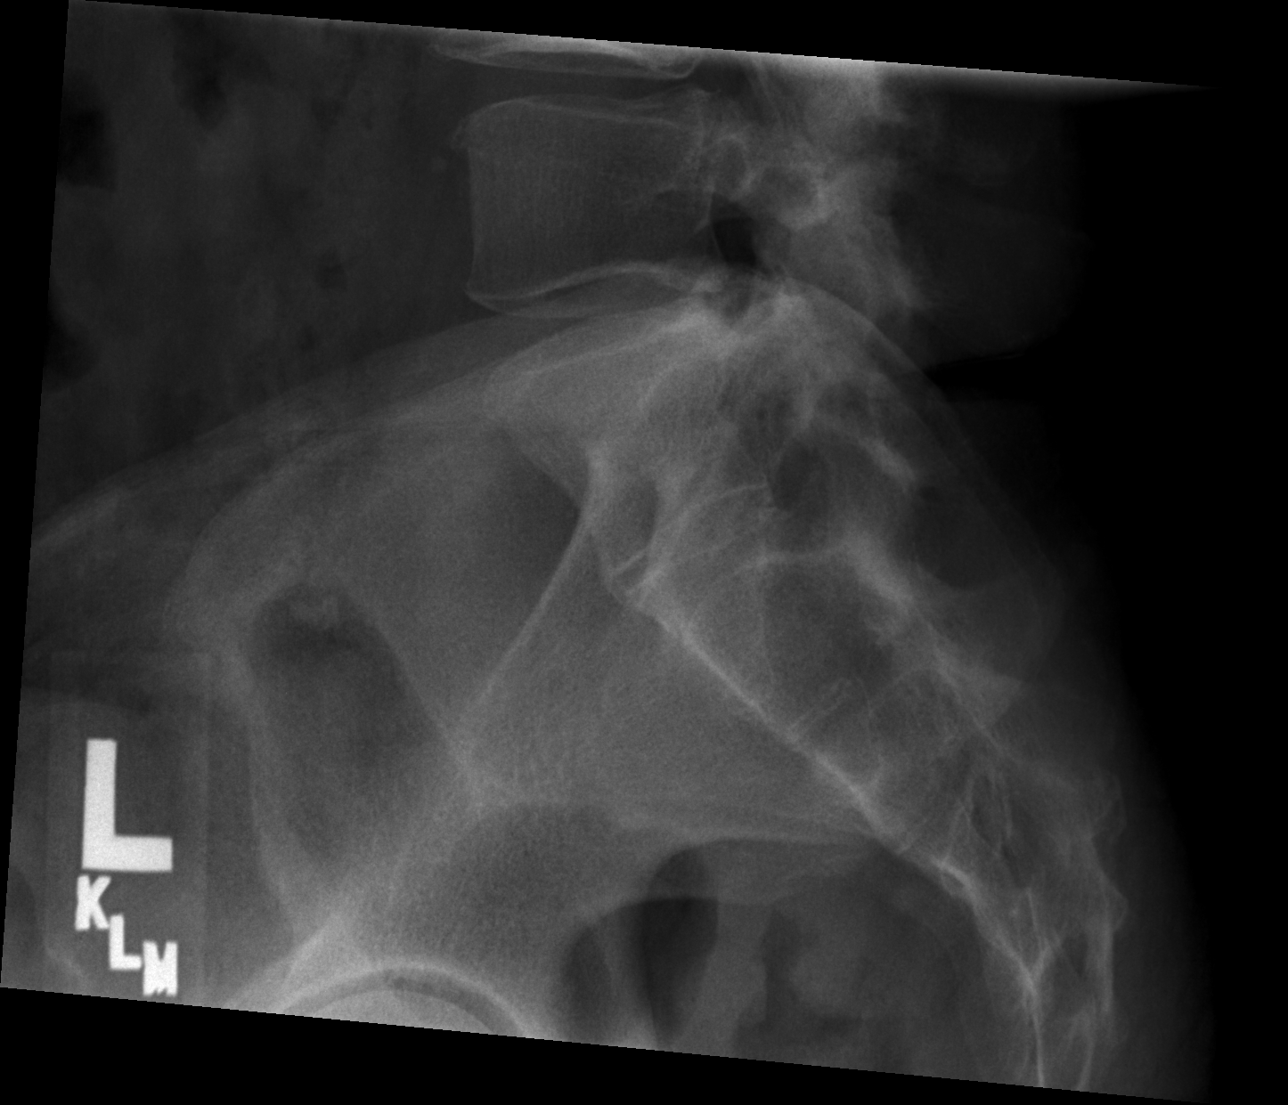

[5 of 5 positions shown; findings below may reference images not displayed]

FINDINGS: Mild left convex lumbar scoliosis.  Normal alignment on
the lateral film except for mild degenerative retrolisthesis of L5.
Mild degenerative disc disease at L4-5 and L5-S1.  No acute bony
findings or destructive bony changes.  No pars defect.  The
visualized bony pelvis is intact.
IMPRESSION: Mild scoliosis and degenerative lumbar spondylosis but no acute
bony findings.

## 2014-12-29 DIAGNOSIS — J019 Acute sinusitis, unspecified: Secondary | ICD-10-CM | POA: Diagnosis not present

## 2015-01-30 DIAGNOSIS — Z800 Family history of malignant neoplasm of digestive organs: Secondary | ICD-10-CM | POA: Diagnosis not present

## 2015-01-30 DIAGNOSIS — M859 Disorder of bone density and structure, unspecified: Secondary | ICD-10-CM | POA: Diagnosis not present

## 2015-01-30 DIAGNOSIS — M8580 Other specified disorders of bone density and structure, unspecified site: Secondary | ICD-10-CM | POA: Diagnosis not present

## 2015-01-30 DIAGNOSIS — Z01411 Encounter for gynecological examination (general) (routine) with abnormal findings: Secondary | ICD-10-CM | POA: Diagnosis not present

## 2015-01-30 DIAGNOSIS — Z6831 Body mass index (BMI) 31.0-31.9, adult: Secondary | ICD-10-CM | POA: Diagnosis not present

## 2015-01-30 DIAGNOSIS — D259 Leiomyoma of uterus, unspecified: Secondary | ICD-10-CM | POA: Diagnosis not present

## 2015-01-30 DIAGNOSIS — Z113 Encounter for screening for infections with a predominantly sexual mode of transmission: Secondary | ICD-10-CM | POA: Diagnosis not present

## 2015-02-13 ENCOUNTER — Encounter: Payer: Self-pay | Admitting: Gastroenterology

## 2015-04-04 ENCOUNTER — Ambulatory Visit (AMBULATORY_SURGERY_CENTER): Payer: Self-pay

## 2015-04-04 VITALS — Ht 63.0 in | Wt 173.4 lb

## 2015-04-04 DIAGNOSIS — Z800 Family history of malignant neoplasm of digestive organs: Secondary | ICD-10-CM

## 2015-04-04 NOTE — Progress Notes (Signed)
No allergies to eggs or soy No home oxygen No diet/weight loss meds No past problems with anesthesia except "kinda sensitive"  Refused emmi "had one before"

## 2015-04-18 ENCOUNTER — Ambulatory Visit (AMBULATORY_SURGERY_CENTER): Payer: Medicare Other | Admitting: Gastroenterology

## 2015-04-18 ENCOUNTER — Encounter: Payer: Self-pay | Admitting: Gastroenterology

## 2015-04-18 VITALS — BP 137/90 | HR 67 | Temp 97.3°F | Resp 19 | Ht 64.0 in | Wt 177.0 lb

## 2015-04-18 DIAGNOSIS — J45909 Unspecified asthma, uncomplicated: Secondary | ICD-10-CM | POA: Diagnosis not present

## 2015-04-18 DIAGNOSIS — I10 Essential (primary) hypertension: Secondary | ICD-10-CM | POA: Diagnosis not present

## 2015-04-18 DIAGNOSIS — Z800 Family history of malignant neoplasm of digestive organs: Secondary | ICD-10-CM | POA: Diagnosis not present

## 2015-04-18 DIAGNOSIS — Z1211 Encounter for screening for malignant neoplasm of colon: Secondary | ICD-10-CM | POA: Diagnosis not present

## 2015-04-18 DIAGNOSIS — K5730 Diverticulosis of large intestine without perforation or abscess without bleeding: Secondary | ICD-10-CM

## 2015-04-18 MED ORDER — SODIUM CHLORIDE 0.9 % IV SOLN: 500.0000 mL | INTRAVENOUS | Status: DC

## 2015-04-18 NOTE — Progress Notes (Signed)
Transferred to recovery room. A/O x3, pleased with MAC.  VSS.  Report to Wendy, RN. 

## 2015-04-18 NOTE — Op Note (Signed)
[place]  [address]. [place], [address]   COLONOSCOPY PROCEDURE REPORT  PATIENT: Jill Lawrence, Jill Lawrence  MR#: [id number] BIRTHDATE: 1945/07/25 , 68  yrs. old GENDER: female ENDOSCOPIST: Milus Banister, MD PROCEDURE DATE:  04/18/2015 PROCEDURE:   Colonoscopy, screening First Screening Colonoscopy - Avg.  risk and is 50 yrs.  old or older - No.  Prior Negative Screening - Now for repeat screening. Above average risk  History of Adenoma - Now for follow-up colonoscopy & has been > or = to 3 yrs.  N/A  high risk ASA CLASS:   Class II INDICATIONS:Screening for colonic neoplasia, FH Colon or Rectal Adenocarcinoma, and sister had colon cancer. MEDICATIONS: Monitored anesthesia care and Propofol 200 mg IV  DESCRIPTION OF PROCEDURE:   After the risks benefits and alternatives of the procedure were thoroughly explained, informed consent was obtained.  The digital rectal exam revealed no abnormalities of the rectum.   The LB [id number]  endoscope was introduced through the anus and advanced to the cecum, which was identified by both the appendix and ileocecal valve. No adverse events experienced.   The quality of the prep was excellent.  The instrument was then slowly withdrawn as the colon was fully examined. Estimated blood loss is zero unless otherwise noted in this procedure report.   COLON FINDINGS: There was moderate diverticulosis noted in the left colon.   The examination was otherwise normal.  Retroflexed views revealed no abnormalities. The time to cecum = 3.8 Withdrawal time = 6.7   The scope was withdrawn and the procedure completed. COMPLICATIONS: There were no immediate complications.  ENDOSCOPIC IMPRESSION: 1.   Moderate diverticulosis was noted in the left colon 2.   The examination was otherwise normal  RECOMMENDATIONS: Given your significant family history of colon cancer (sister), you should have a repeat colonoscopy in 5  years  eSigned:  Milus Banister, MD 04/18/2015 9:04 AM   cc: Lucianne Lei, MD

## 2015-04-18 NOTE — Patient Instructions (Signed)
YOU HAD AN ENDOSCOPIC PROCEDURE TODAY AT THE [facility]:   Refer to the procedure report that was given to you for any specific questions about what was found during the examination.  If the procedure report does not answer your questions, please call your gastroenterologist to clarify.  If you requested that your care partner not be given the details of your procedure findings, then the procedure report has been included in a sealed envelope for you to review at your convenience later.  YOU SHOULD EXPECT: Some feelings of bloating in the abdomen. Passage of more gas than usual.  Walking can help get rid of the air that was put into your GI tract during the procedure and reduce the bloating. If you had a lower endoscopy (such as a colonoscopy or flexible sigmoidoscopy) you may notice spotting of blood in your stool or on the toilet paper. If you underwent a bowel prep for your procedure, you may not have a normal bowel movement for a few days.  Please Note:  You might notice some irritation and congestion in your nose or some drainage.  This is from the oxygen used during your procedure.  There is no need for concern and it should clear up in a day or so.  SYMPTOMS TO REPORT IMMEDIATELY:   Following lower endoscopy (colonoscopy or flexible sigmoidoscopy):  Excessive amounts of blood in the stool  Significant tenderness or worsening of abdominal pains  Swelling of the abdomen that is new, acute  Fever of 100F or higher   For urgent or emergent issues, a gastroenterologist can be reached at any hour by calling [phone].   DIET: Your first meal following the procedure should be a small meal and then it is ok to progress to your normal diet. Heavy or fried foods are harder to digest and may make you feel nauseous or bloated.  Likewise, meals heavy in dairy and vegetables can increase bloating.  Drink plenty of fluids but you should avoid alcoholic beverages for 24  hours.  ACTIVITY:  You should plan to take it easy for the rest of today and you should NOT DRIVE or use heavy machinery until tomorrow (because of the sedation medicines used during the test).    FOLLOW UP: Our staff will call the number listed on your records the next business day following your procedure to check on you and address any questions or concerns that you may have regarding the information given to you following your procedure. If we do not reach you, we will leave a message.  However, if you are feeling well and you are not experiencing any problems, there is no need to return our call.  We will assume that you have returned to your regular daily activities without incident.  If any biopsies were taken you will be contacted by phone or by letter within the next 1-3 weeks.  Please call us at [phone] if you have not heard about the biopsies in 3 weeks.    SIGNATURES/CONFIDENTIALITY: You and/or your care partner have signed paperwork which will be entered into your electronic medical record.  These signatures attest to the fact that that the information above on your After Visit Summary has been reviewed and is understood.  Full responsibility of the confidentiality of this discharge information lies with you and/or your care-partner.  Please review diverticulosis and high fiber diet handouts provided. 

## 2015-04-19 ENCOUNTER — Telehealth: Payer: Self-pay | Admitting: *Deleted

## 2015-04-19 NOTE — Telephone Encounter (Signed)
  Follow up Call-  Call back number 04/18/2015  Post procedure Call Back phone  # [phone]  Permission to leave phone message Yes     Patient questions:  Do you have a fever, pain , or abdominal swelling? No. Pain Score  0 *  Have you tolerated food without any problems? No.  Have you been able to return to your normal activities? No.  Do you have any questions about your discharge instructions: Diet   No. Medications  No. Follow up visit  No.  Do you have questions or concerns about your Care? No.  Actions: * If pain score is 4 or above: No action needed, pain <4.

## 2015-07-11 DIAGNOSIS — I10 Essential (primary) hypertension: Secondary | ICD-10-CM | POA: Diagnosis not present

## 2015-07-11 DIAGNOSIS — H2513 Age-related nuclear cataract, bilateral: Secondary | ICD-10-CM | POA: Diagnosis not present

## 2015-07-11 DIAGNOSIS — H16223 Keratoconjunctivitis sicca, not specified as Sjogren's, bilateral: Secondary | ICD-10-CM | POA: Diagnosis not present

## 2015-08-09 DIAGNOSIS — Z6831 Body mass index (BMI) 31.0-31.9, adult: Secondary | ICD-10-CM | POA: Diagnosis not present

## 2015-08-09 DIAGNOSIS — I10 Essential (primary) hypertension: Secondary | ICD-10-CM | POA: Diagnosis not present

## 2015-08-09 DIAGNOSIS — R7309 Other abnormal glucose: Secondary | ICD-10-CM | POA: Diagnosis not present

## 2015-10-10 DIAGNOSIS — M170 Bilateral primary osteoarthritis of knee: Secondary | ICD-10-CM | POA: Diagnosis not present

## 2015-12-26 DIAGNOSIS — R7309 Other abnormal glucose: Secondary | ICD-10-CM | POA: Diagnosis not present

## 2015-12-26 DIAGNOSIS — I10 Essential (primary) hypertension: Secondary | ICD-10-CM | POA: Diagnosis not present

## 2016-01-02 DIAGNOSIS — M1711 Unilateral primary osteoarthritis, right knee: Secondary | ICD-10-CM | POA: Diagnosis not present

## 2016-01-02 DIAGNOSIS — M25551 Pain in right hip: Secondary | ICD-10-CM | POA: Diagnosis not present

## 2016-01-02 DIAGNOSIS — M1712 Unilateral primary osteoarthritis, left knee: Secondary | ICD-10-CM | POA: Diagnosis not present

## 2016-02-20 DIAGNOSIS — Z01419 Encounter for gynecological examination (general) (routine) without abnormal findings: Secondary | ICD-10-CM | POA: Diagnosis not present

## 2016-02-20 DIAGNOSIS — Z6831 Body mass index (BMI) 31.0-31.9, adult: Secondary | ICD-10-CM | POA: Diagnosis not present

## 2016-02-20 DIAGNOSIS — Z1231 Encounter for screening mammogram for malignant neoplasm of breast: Secondary | ICD-10-CM | POA: Diagnosis not present

## 2016-02-20 DIAGNOSIS — D259 Leiomyoma of uterus, unspecified: Secondary | ICD-10-CM | POA: Diagnosis not present

## 2016-02-20 DIAGNOSIS — B009 Herpesviral infection, unspecified: Secondary | ICD-10-CM | POA: Diagnosis not present

## 2016-04-15 DIAGNOSIS — D219 Benign neoplasm of connective and other soft tissue, unspecified: Secondary | ICD-10-CM | POA: Diagnosis not present

## 2016-04-15 DIAGNOSIS — L820 Inflamed seborrheic keratosis: Secondary | ICD-10-CM | POA: Diagnosis not present

## 2016-07-03 DIAGNOSIS — I10 Essential (primary) hypertension: Secondary | ICD-10-CM | POA: Diagnosis not present

## 2016-07-03 DIAGNOSIS — Z79899 Other long term (current) drug therapy: Secondary | ICD-10-CM | POA: Diagnosis not present

## 2016-10-10 DIAGNOSIS — L853 Xerosis cutis: Secondary | ICD-10-CM | POA: Diagnosis not present

## 2016-10-10 DIAGNOSIS — B351 Tinea unguium: Secondary | ICD-10-CM | POA: Diagnosis not present

## 2016-10-30 DIAGNOSIS — M1712 Unilateral primary osteoarthritis, left knee: Secondary | ICD-10-CM | POA: Diagnosis not present

## 2016-10-30 DIAGNOSIS — M170 Bilateral primary osteoarthritis of knee: Secondary | ICD-10-CM | POA: Diagnosis not present

## 2016-10-30 DIAGNOSIS — M1711 Unilateral primary osteoarthritis, right knee: Secondary | ICD-10-CM | POA: Diagnosis not present

## 2016-11-07 DIAGNOSIS — B351 Tinea unguium: Secondary | ICD-10-CM | POA: Diagnosis not present

## 2016-12-24 DIAGNOSIS — H524 Presbyopia: Secondary | ICD-10-CM | POA: Diagnosis not present

## 2016-12-24 DIAGNOSIS — H2513 Age-related nuclear cataract, bilateral: Secondary | ICD-10-CM | POA: Diagnosis not present

## 2016-12-24 DIAGNOSIS — I10 Essential (primary) hypertension: Secondary | ICD-10-CM | POA: Diagnosis not present

## 2016-12-24 DIAGNOSIS — H52222 Regular astigmatism, left eye: Secondary | ICD-10-CM | POA: Diagnosis not present

## 2016-12-24 DIAGNOSIS — H5203 Hypermetropia, bilateral: Secondary | ICD-10-CM | POA: Diagnosis not present

## 2016-12-24 DIAGNOSIS — H16223 Keratoconjunctivitis sicca, not specified as Sjogren's, bilateral: Secondary | ICD-10-CM | POA: Diagnosis not present

## 2016-12-24 DIAGNOSIS — H43812 Vitreous degeneration, left eye: Secondary | ICD-10-CM | POA: Diagnosis not present

## 2017-02-02 ENCOUNTER — Other Ambulatory Visit: Payer: Self-pay

## 2017-02-10 DIAGNOSIS — R7309 Other abnormal glucose: Secondary | ICD-10-CM | POA: Diagnosis not present

## 2017-02-10 DIAGNOSIS — I10 Essential (primary) hypertension: Secondary | ICD-10-CM | POA: Diagnosis not present

## 2017-02-18 DIAGNOSIS — M170 Bilateral primary osteoarthritis of knee: Secondary | ICD-10-CM | POA: Diagnosis not present

## 2017-02-25 DIAGNOSIS — M1711 Unilateral primary osteoarthritis, right knee: Secondary | ICD-10-CM | POA: Diagnosis not present

## 2017-02-26 DIAGNOSIS — Z01419 Encounter for gynecological examination (general) (routine) without abnormal findings: Secondary | ICD-10-CM | POA: Diagnosis not present

## 2017-02-26 DIAGNOSIS — Z124 Encounter for screening for malignant neoplasm of cervix: Secondary | ICD-10-CM | POA: Diagnosis not present

## 2017-02-26 DIAGNOSIS — Z1231 Encounter for screening mammogram for malignant neoplasm of breast: Secondary | ICD-10-CM | POA: Diagnosis not present

## 2017-03-04 DIAGNOSIS — M170 Bilateral primary osteoarthritis of knee: Secondary | ICD-10-CM | POA: Diagnosis not present

## 2017-04-15 DIAGNOSIS — M1711 Unilateral primary osteoarthritis, right knee: Secondary | ICD-10-CM | POA: Diagnosis not present

## 2017-04-15 DIAGNOSIS — M1712 Unilateral primary osteoarthritis, left knee: Secondary | ICD-10-CM | POA: Diagnosis not present

## 2017-06-10 DIAGNOSIS — R7309 Other abnormal glucose: Secondary | ICD-10-CM | POA: Diagnosis not present

## 2017-06-10 DIAGNOSIS — I10 Essential (primary) hypertension: Secondary | ICD-10-CM | POA: Diagnosis not present

## 2017-06-10 DIAGNOSIS — M130 Polyarthritis, unspecified: Secondary | ICD-10-CM | POA: Diagnosis not present

## 2017-10-02 DIAGNOSIS — M1712 Unilateral primary osteoarthritis, left knee: Secondary | ICD-10-CM | POA: Diagnosis not present

## 2017-10-02 DIAGNOSIS — M1711 Unilateral primary osteoarthritis, right knee: Secondary | ICD-10-CM | POA: Diagnosis not present

## 2017-11-02 DIAGNOSIS — M1711 Unilateral primary osteoarthritis, right knee: Secondary | ICD-10-CM | POA: Diagnosis not present

## 2017-11-02 DIAGNOSIS — M1712 Unilateral primary osteoarthritis, left knee: Secondary | ICD-10-CM | POA: Diagnosis not present

## 2017-11-10 DIAGNOSIS — M1712 Unilateral primary osteoarthritis, left knee: Secondary | ICD-10-CM | POA: Diagnosis not present

## 2017-11-10 DIAGNOSIS — M1711 Unilateral primary osteoarthritis, right knee: Secondary | ICD-10-CM | POA: Diagnosis not present

## 2017-11-17 DIAGNOSIS — M1711 Unilateral primary osteoarthritis, right knee: Secondary | ICD-10-CM | POA: Diagnosis not present

## 2017-11-17 DIAGNOSIS — M1712 Unilateral primary osteoarthritis, left knee: Secondary | ICD-10-CM | POA: Diagnosis not present

## 2017-12-07 DIAGNOSIS — G5603 Carpal tunnel syndrome, bilateral upper limbs: Secondary | ICD-10-CM | POA: Diagnosis not present

## 2017-12-07 DIAGNOSIS — R200 Anesthesia of skin: Secondary | ICD-10-CM | POA: Diagnosis not present

## 2017-12-29 DIAGNOSIS — M13862 Other specified arthritis, left knee: Secondary | ICD-10-CM | POA: Diagnosis not present

## 2017-12-29 DIAGNOSIS — M13861 Other specified arthritis, right knee: Secondary | ICD-10-CM | POA: Diagnosis not present

## 2018-02-04 DIAGNOSIS — G5602 Carpal tunnel syndrome, left upper limb: Secondary | ICD-10-CM | POA: Diagnosis not present

## 2018-02-04 DIAGNOSIS — G5601 Carpal tunnel syndrome, right upper limb: Secondary | ICD-10-CM | POA: Diagnosis not present

## 2018-02-04 DIAGNOSIS — R200 Anesthesia of skin: Secondary | ICD-10-CM | POA: Diagnosis not present

## 2018-02-09 DIAGNOSIS — H16223 Keratoconjunctivitis sicca, not specified as Sjogren's, bilateral: Secondary | ICD-10-CM | POA: Diagnosis not present

## 2018-02-09 DIAGNOSIS — H2513 Age-related nuclear cataract, bilateral: Secondary | ICD-10-CM | POA: Diagnosis not present

## 2018-02-09 DIAGNOSIS — H43813 Vitreous degeneration, bilateral: Secondary | ICD-10-CM | POA: Diagnosis not present

## 2018-03-03 DIAGNOSIS — Z6832 Body mass index (BMI) 32.0-32.9, adult: Secondary | ICD-10-CM | POA: Diagnosis not present

## 2018-03-03 DIAGNOSIS — D259 Leiomyoma of uterus, unspecified: Secondary | ICD-10-CM | POA: Diagnosis not present

## 2018-03-03 DIAGNOSIS — Z01411 Encounter for gynecological examination (general) (routine) with abnormal findings: Secondary | ICD-10-CM | POA: Diagnosis not present

## 2018-03-03 DIAGNOSIS — M8580 Other specified disorders of bone density and structure, unspecified site: Secondary | ICD-10-CM | POA: Diagnosis not present

## 2018-03-03 DIAGNOSIS — Z1231 Encounter for screening mammogram for malignant neoplasm of breast: Secondary | ICD-10-CM | POA: Diagnosis not present

## 2018-03-03 DIAGNOSIS — N952 Postmenopausal atrophic vaginitis: Secondary | ICD-10-CM | POA: Diagnosis not present

## 2018-03-24 DIAGNOSIS — M8589 Other specified disorders of bone density and structure, multiple sites: Secondary | ICD-10-CM | POA: Diagnosis not present

## 2018-03-24 DIAGNOSIS — M8580 Other specified disorders of bone density and structure, unspecified site: Secondary | ICD-10-CM | POA: Diagnosis not present

## 2018-05-27 DIAGNOSIS — Z0000 Encounter for general adult medical examination without abnormal findings: Secondary | ICD-10-CM | POA: Diagnosis not present

## 2018-05-28 DIAGNOSIS — R7309 Other abnormal glucose: Secondary | ICD-10-CM | POA: Diagnosis not present

## 2018-05-28 DIAGNOSIS — Z6831 Body mass index (BMI) 31.0-31.9, adult: Secondary | ICD-10-CM | POA: Diagnosis not present

## 2018-05-28 DIAGNOSIS — M130 Polyarthritis, unspecified: Secondary | ICD-10-CM | POA: Diagnosis not present

## 2018-05-28 DIAGNOSIS — I10 Essential (primary) hypertension: Secondary | ICD-10-CM | POA: Diagnosis not present

## 2018-05-28 DIAGNOSIS — J019 Acute sinusitis, unspecified: Secondary | ICD-10-CM | POA: Diagnosis not present

## 2018-07-27 DIAGNOSIS — M13862 Other specified arthritis, left knee: Secondary | ICD-10-CM | POA: Diagnosis not present

## 2018-07-27 DIAGNOSIS — M13861 Other specified arthritis, right knee: Secondary | ICD-10-CM | POA: Diagnosis not present

## 2018-08-10 DIAGNOSIS — M1712 Unilateral primary osteoarthritis, left knee: Secondary | ICD-10-CM | POA: Diagnosis not present

## 2018-08-10 DIAGNOSIS — M1711 Unilateral primary osteoarthritis, right knee: Secondary | ICD-10-CM | POA: Diagnosis not present

## 2018-08-11 DIAGNOSIS — M5136 Other intervertebral disc degeneration, lumbar region: Secondary | ICD-10-CM | POA: Diagnosis not present

## 2018-08-11 DIAGNOSIS — M170 Bilateral primary osteoarthritis of knee: Secondary | ICD-10-CM | POA: Diagnosis not present

## 2018-08-17 DIAGNOSIS — M1712 Unilateral primary osteoarthritis, left knee: Secondary | ICD-10-CM | POA: Diagnosis not present

## 2018-08-17 DIAGNOSIS — M1711 Unilateral primary osteoarthritis, right knee: Secondary | ICD-10-CM | POA: Diagnosis not present

## 2018-08-24 DIAGNOSIS — M170 Bilateral primary osteoarthritis of knee: Secondary | ICD-10-CM | POA: Diagnosis not present

## 2019-02-07 ENCOUNTER — Other Ambulatory Visit: Payer: Self-pay

## 2019-03-23 DIAGNOSIS — M8580 Other specified disorders of bone density and structure, unspecified site: Secondary | ICD-10-CM | POA: Diagnosis not present

## 2019-03-23 DIAGNOSIS — Z1231 Encounter for screening mammogram for malignant neoplasm of breast: Secondary | ICD-10-CM | POA: Diagnosis not present

## 2019-03-23 DIAGNOSIS — Z01419 Encounter for gynecological examination (general) (routine) without abnormal findings: Secondary | ICD-10-CM | POA: Diagnosis not present

## 2019-03-23 DIAGNOSIS — Z0001 Encounter for general adult medical examination with abnormal findings: Secondary | ICD-10-CM | POA: Diagnosis not present

## 2019-03-23 DIAGNOSIS — Z6831 Body mass index (BMI) 31.0-31.9, adult: Secondary | ICD-10-CM | POA: Diagnosis not present

## 2019-03-31 DIAGNOSIS — M130 Polyarthritis, unspecified: Secondary | ICD-10-CM | POA: Diagnosis not present

## 2019-03-31 DIAGNOSIS — E785 Hyperlipidemia, unspecified: Secondary | ICD-10-CM | POA: Diagnosis not present

## 2019-03-31 DIAGNOSIS — R7309 Other abnormal glucose: Secondary | ICD-10-CM | POA: Diagnosis not present

## 2019-03-31 DIAGNOSIS — I10 Essential (primary) hypertension: Secondary | ICD-10-CM | POA: Diagnosis not present

## 2019-03-31 DIAGNOSIS — E7800 Pure hypercholesterolemia, unspecified: Secondary | ICD-10-CM | POA: Diagnosis not present

## 2019-04-11 DIAGNOSIS — M1712 Unilateral primary osteoarthritis, left knee: Secondary | ICD-10-CM | POA: Diagnosis not present

## 2019-04-11 DIAGNOSIS — M170 Bilateral primary osteoarthritis of knee: Secondary | ICD-10-CM | POA: Diagnosis not present

## 2019-04-11 DIAGNOSIS — M1711 Unilateral primary osteoarthritis, right knee: Secondary | ICD-10-CM | POA: Diagnosis not present

## 2019-04-18 DIAGNOSIS — M170 Bilateral primary osteoarthritis of knee: Secondary | ICD-10-CM | POA: Diagnosis not present

## 2019-04-18 DIAGNOSIS — M1712 Unilateral primary osteoarthritis, left knee: Secondary | ICD-10-CM | POA: Diagnosis not present

## 2019-04-18 DIAGNOSIS — M1711 Unilateral primary osteoarthritis, right knee: Secondary | ICD-10-CM | POA: Diagnosis not present

## 2019-04-25 DIAGNOSIS — M1712 Unilateral primary osteoarthritis, left knee: Secondary | ICD-10-CM | POA: Diagnosis not present

## 2019-04-25 DIAGNOSIS — M1711 Unilateral primary osteoarthritis, right knee: Secondary | ICD-10-CM | POA: Diagnosis not present

## 2019-04-25 DIAGNOSIS — M170 Bilateral primary osteoarthritis of knee: Secondary | ICD-10-CM | POA: Diagnosis not present

## 2019-04-27 DIAGNOSIS — H16223 Keratoconjunctivitis sicca, not specified as Sjogren's, bilateral: Secondary | ICD-10-CM | POA: Diagnosis not present

## 2019-04-27 DIAGNOSIS — H2513 Age-related nuclear cataract, bilateral: Secondary | ICD-10-CM | POA: Diagnosis not present

## 2019-04-27 DIAGNOSIS — H43813 Vitreous degeneration, bilateral: Secondary | ICD-10-CM | POA: Diagnosis not present

## 2019-08-05 ENCOUNTER — Ambulatory Visit: Payer: Medicare Other

## 2019-08-22 ENCOUNTER — Ambulatory Visit: Payer: Medicare Other

## 2019-11-11 DIAGNOSIS — E785 Hyperlipidemia, unspecified: Secondary | ICD-10-CM | POA: Diagnosis not present

## 2019-11-11 DIAGNOSIS — M130 Polyarthritis, unspecified: Secondary | ICD-10-CM | POA: Diagnosis not present

## 2019-11-11 DIAGNOSIS — R7309 Other abnormal glucose: Secondary | ICD-10-CM | POA: Diagnosis not present

## 2019-11-11 DIAGNOSIS — I10 Essential (primary) hypertension: Secondary | ICD-10-CM | POA: Diagnosis not present

## 2019-11-14 DIAGNOSIS — E785 Hyperlipidemia, unspecified: Secondary | ICD-10-CM | POA: Diagnosis not present

## 2019-11-14 DIAGNOSIS — I10 Essential (primary) hypertension: Secondary | ICD-10-CM | POA: Diagnosis not present

## 2019-11-14 DIAGNOSIS — R739 Hyperglycemia, unspecified: Secondary | ICD-10-CM | POA: Diagnosis not present

## 2020-02-14 DIAGNOSIS — M1712 Unilateral primary osteoarthritis, left knee: Secondary | ICD-10-CM | POA: Diagnosis not present

## 2020-02-14 DIAGNOSIS — M170 Bilateral primary osteoarthritis of knee: Secondary | ICD-10-CM | POA: Diagnosis not present

## 2020-02-14 DIAGNOSIS — M1711 Unilateral primary osteoarthritis, right knee: Secondary | ICD-10-CM | POA: Diagnosis not present

## 2020-02-21 DIAGNOSIS — M170 Bilateral primary osteoarthritis of knee: Secondary | ICD-10-CM | POA: Diagnosis not present

## 2020-02-28 DIAGNOSIS — M170 Bilateral primary osteoarthritis of knee: Secondary | ICD-10-CM | POA: Diagnosis not present

## 2020-02-28 DIAGNOSIS — M1712 Unilateral primary osteoarthritis, left knee: Secondary | ICD-10-CM | POA: Diagnosis not present

## 2020-02-28 DIAGNOSIS — M1711 Unilateral primary osteoarthritis, right knee: Secondary | ICD-10-CM | POA: Diagnosis not present

## 2020-03-12 DIAGNOSIS — S93601A Unspecified sprain of right foot, initial encounter: Secondary | ICD-10-CM | POA: Diagnosis not present

## 2020-03-19 DIAGNOSIS — I10 Essential (primary) hypertension: Secondary | ICD-10-CM | POA: Diagnosis not present

## 2020-03-19 DIAGNOSIS — E782 Mixed hyperlipidemia: Secondary | ICD-10-CM | POA: Diagnosis not present

## 2020-03-19 DIAGNOSIS — Z6830 Body mass index (BMI) 30.0-30.9, adult: Secondary | ICD-10-CM | POA: Diagnosis not present

## 2020-03-19 DIAGNOSIS — R7309 Other abnormal glucose: Secondary | ICD-10-CM | POA: Diagnosis not present

## 2020-03-28 DIAGNOSIS — Z6831 Body mass index (BMI) 31.0-31.9, adult: Secondary | ICD-10-CM | POA: Diagnosis not present

## 2020-03-28 DIAGNOSIS — Z0000 Encounter for general adult medical examination without abnormal findings: Secondary | ICD-10-CM | POA: Diagnosis not present

## 2020-03-28 DIAGNOSIS — Z0001 Encounter for general adult medical examination with abnormal findings: Secondary | ICD-10-CM | POA: Diagnosis not present

## 2020-03-28 DIAGNOSIS — Z1231 Encounter for screening mammogram for malignant neoplasm of breast: Secondary | ICD-10-CM | POA: Diagnosis not present

## 2020-03-28 DIAGNOSIS — I10 Essential (primary) hypertension: Secondary | ICD-10-CM | POA: Diagnosis not present

## 2020-04-10 DIAGNOSIS — M1712 Unilateral primary osteoarthritis, left knee: Secondary | ICD-10-CM | POA: Diagnosis not present

## 2020-04-10 DIAGNOSIS — M1711 Unilateral primary osteoarthritis, right knee: Secondary | ICD-10-CM | POA: Diagnosis not present

## 2020-04-10 DIAGNOSIS — M170 Bilateral primary osteoarthritis of knee: Secondary | ICD-10-CM | POA: Diagnosis not present

## 2020-04-10 DIAGNOSIS — M79671 Pain in right foot: Secondary | ICD-10-CM | POA: Diagnosis not present

## 2020-04-13 DIAGNOSIS — M216X1 Other acquired deformities of right foot: Secondary | ICD-10-CM | POA: Diagnosis not present

## 2020-04-13 DIAGNOSIS — M79671 Pain in right foot: Secondary | ICD-10-CM | POA: Diagnosis not present

## 2020-04-13 DIAGNOSIS — M25571 Pain in right ankle and joints of right foot: Secondary | ICD-10-CM | POA: Diagnosis not present

## 2020-04-13 DIAGNOSIS — M7671 Peroneal tendinitis, right leg: Secondary | ICD-10-CM | POA: Diagnosis not present

## 2020-04-26 DIAGNOSIS — M7671 Peroneal tendinitis, right leg: Secondary | ICD-10-CM | POA: Diagnosis not present

## 2020-04-26 DIAGNOSIS — M79671 Pain in right foot: Secondary | ICD-10-CM | POA: Diagnosis not present

## 2020-04-27 DIAGNOSIS — M7671 Peroneal tendinitis, right leg: Secondary | ICD-10-CM | POA: Diagnosis not present

## 2020-04-27 DIAGNOSIS — M79671 Pain in right foot: Secondary | ICD-10-CM | POA: Diagnosis not present

## 2020-04-30 DIAGNOSIS — M7671 Peroneal tendinitis, right leg: Secondary | ICD-10-CM | POA: Diagnosis not present

## 2020-04-30 DIAGNOSIS — M79671 Pain in right foot: Secondary | ICD-10-CM | POA: Diagnosis not present

## 2020-05-02 DIAGNOSIS — M7671 Peroneal tendinitis, right leg: Secondary | ICD-10-CM | POA: Diagnosis not present

## 2020-05-02 DIAGNOSIS — M79671 Pain in right foot: Secondary | ICD-10-CM | POA: Diagnosis not present

## 2020-05-04 DIAGNOSIS — M7671 Peroneal tendinitis, right leg: Secondary | ICD-10-CM | POA: Diagnosis not present

## 2020-05-04 DIAGNOSIS — M79671 Pain in right foot: Secondary | ICD-10-CM | POA: Diagnosis not present

## 2020-05-07 DIAGNOSIS — M7671 Peroneal tendinitis, right leg: Secondary | ICD-10-CM | POA: Diagnosis not present

## 2020-05-07 DIAGNOSIS — M79671 Pain in right foot: Secondary | ICD-10-CM | POA: Diagnosis not present

## 2020-05-11 DIAGNOSIS — M7671 Peroneal tendinitis, right leg: Secondary | ICD-10-CM | POA: Diagnosis not present

## 2020-05-11 DIAGNOSIS — M79671 Pain in right foot: Secondary | ICD-10-CM | POA: Diagnosis not present

## 2020-05-22 ENCOUNTER — Ambulatory Visit: Payer: Medicare Other

## 2020-05-24 ENCOUNTER — Ambulatory Visit: Payer: Medicare Other | Attending: Internal Medicine

## 2020-05-24 DIAGNOSIS — Z23 Encounter for immunization: Secondary | ICD-10-CM

## 2020-05-24 NOTE — Progress Notes (Signed)
   Covid-19 Vaccination Clinic  Name:  JOWANA THUMMA    MRN: [id number] DOB: 03-08-1946  05/24/2020  Ms. Nicklin was observed post Covid-19 immunization for 15 minutes without incident. She was provided with Vaccine Information Sheet and instruction to access the V-Safe system.   Ms. Mcmasters was instructed to call 911 with any severe reactions post vaccine: Marland Kitchen Difficulty breathing  . Swelling of face and throat  . A fast heartbeat  . A bad rash all over body  . Dizziness and weakness   Immunizations Administered    Name Date Dose VIS Date Route   Pfizer COVID-19 Vaccine 05/24/2020  1:38 PM 0.3 mL 04/25/2020 Intramuscular   Manufacturer: [place]   Lot: [id number]   [place]: [address]-2

## 2020-07-19 ENCOUNTER — Ambulatory Visit
Admission: RE | Admit: 2020-07-19 | Discharge: 2020-07-19 | Disposition: A | Discharge status: Home / Self Care | Payer: Medicare Other | Source: Ambulatory Visit | Attending: Family Medicine | Admitting: Family Medicine

## 2020-07-19 ENCOUNTER — Other Ambulatory Visit: Payer: Self-pay | Admitting: Family Medicine

## 2020-07-19 DIAGNOSIS — Z0000 Encounter for general adult medical examination without abnormal findings: Secondary | ICD-10-CM | POA: Diagnosis not present

## 2020-07-19 DIAGNOSIS — I10 Essential (primary) hypertension: Secondary | ICD-10-CM | POA: Diagnosis not present

## 2020-07-19 DIAGNOSIS — M1009 Idiopathic gout, multiple sites: Secondary | ICD-10-CM | POA: Diagnosis not present

## 2020-07-19 DIAGNOSIS — R739 Hyperglycemia, unspecified: Secondary | ICD-10-CM | POA: Diagnosis not present

## 2020-07-19 DIAGNOSIS — E785 Hyperlipidemia, unspecified: Secondary | ICD-10-CM | POA: Diagnosis not present

## 2020-07-19 DIAGNOSIS — M1310 Monoarthritis, not elsewhere classified, unspecified site: Secondary | ICD-10-CM | POA: Diagnosis not present

## 2020-07-19 DIAGNOSIS — M109 Gout, unspecified: Secondary | ICD-10-CM

## 2020-07-19 DIAGNOSIS — R52 Pain, unspecified: Secondary | ICD-10-CM

## 2020-07-19 DIAGNOSIS — M19071 Primary osteoarthritis, right ankle and foot: Secondary | ICD-10-CM | POA: Diagnosis not present

## 2020-07-19 DIAGNOSIS — M130 Polyarthritis, unspecified: Secondary | ICD-10-CM | POA: Diagnosis not present

## 2020-08-20 ENCOUNTER — Encounter: Payer: Self-pay | Admitting: Gastroenterology

## 2020-08-20 DIAGNOSIS — M1009 Idiopathic gout, multiple sites: Secondary | ICD-10-CM | POA: Diagnosis not present

## 2020-08-20 DIAGNOSIS — I10 Essential (primary) hypertension: Secondary | ICD-10-CM | POA: Diagnosis not present

## 2020-10-09 ENCOUNTER — Encounter: Payer: Self-pay | Admitting: Gastroenterology

## 2020-12-10 DIAGNOSIS — M1711 Unilateral primary osteoarthritis, right knee: Secondary | ICD-10-CM | POA: Diagnosis not present

## 2020-12-10 DIAGNOSIS — M170 Bilateral primary osteoarthritis of knee: Secondary | ICD-10-CM | POA: Diagnosis not present

## 2020-12-10 DIAGNOSIS — M1712 Unilateral primary osteoarthritis, left knee: Secondary | ICD-10-CM | POA: Diagnosis not present

## 2020-12-17 DIAGNOSIS — M1711 Unilateral primary osteoarthritis, right knee: Secondary | ICD-10-CM | POA: Diagnosis not present

## 2020-12-17 DIAGNOSIS — M1712 Unilateral primary osteoarthritis, left knee: Secondary | ICD-10-CM | POA: Diagnosis not present

## 2020-12-17 DIAGNOSIS — M170 Bilateral primary osteoarthritis of knee: Secondary | ICD-10-CM | POA: Diagnosis not present

## 2020-12-18 ENCOUNTER — Other Ambulatory Visit: Payer: Self-pay

## 2020-12-18 ENCOUNTER — Ambulatory Visit (AMBULATORY_SURGERY_CENTER): Payer: Medicare Other | Admitting: *Deleted

## 2020-12-18 VITALS — Ht 64.0 in | Wt 174.0 lb

## 2020-12-18 DIAGNOSIS — Z800 Family history of malignant neoplasm of digestive organs: Secondary | ICD-10-CM

## 2020-12-18 NOTE — Progress Notes (Signed)

## 2020-12-24 DIAGNOSIS — M1711 Unilateral primary osteoarthritis, right knee: Secondary | ICD-10-CM | POA: Diagnosis not present

## 2020-12-24 DIAGNOSIS — M170 Bilateral primary osteoarthritis of knee: Secondary | ICD-10-CM | POA: Diagnosis not present

## 2020-12-24 DIAGNOSIS — M1712 Unilateral primary osteoarthritis, left knee: Secondary | ICD-10-CM | POA: Diagnosis not present

## 2021-01-01 ENCOUNTER — Encounter: Payer: Self-pay | Admitting: Gastroenterology

## 2021-01-01 ENCOUNTER — Ambulatory Visit (AMBULATORY_SURGERY_CENTER): Payer: Medicare Other | Admitting: Gastroenterology

## 2021-01-01 ENCOUNTER — Other Ambulatory Visit: Payer: Self-pay

## 2021-01-01 VITALS — BP 132/68 | HR 58 | Temp 97.8°F | Resp 14 | Ht 64.0 in | Wt 174.0 lb

## 2021-01-01 DIAGNOSIS — D125 Benign neoplasm of sigmoid colon: Secondary | ICD-10-CM | POA: Diagnosis not present

## 2021-01-01 DIAGNOSIS — Z800 Family history of malignant neoplasm of digestive organs: Secondary | ICD-10-CM | POA: Diagnosis not present

## 2021-01-01 DIAGNOSIS — Z1211 Encounter for screening for malignant neoplasm of colon: Secondary | ICD-10-CM

## 2021-01-01 DIAGNOSIS — K635 Polyp of colon: Secondary | ICD-10-CM | POA: Diagnosis not present

## 2021-01-01 MED ORDER — SODIUM CHLORIDE 0.9 % IV SOLN: 500.0000 mL | Freq: Once | INTRAVENOUS | Status: DC

## 2021-01-01 NOTE — Op Note (Signed)
[facility] Patient Name: Jill Lawrence Procedure Date: 01/01/2021 10:10 AM MRN: [id number] Endoscopist: Milus Banister , MD Age: 75 Referring MD:  Date of Birth: 08-01-1945 Gender: Female Account #: [id number] Procedure:                Colonoscopy Indications:              Screening in patient at increased risk: sister had                            colon cancer Medicines:                Monitored Anesthesia Care Procedure:                Pre-Anesthesia Assessment:                           - Prior to the procedure, a History and Physical                            was performed, and patient medications and                            allergies were reviewed. The patient's tolerance of                            previous anesthesia was also reviewed. The risks                            and benefits of the procedure and the sedation                            options and risks were discussed with the patient.                            All questions were answered, and informed consent                            was obtained. Prior Anticoagulants: The patient has                            taken no previous anticoagulant or antiplatelet                            agents. ASA Grade Assessment: II - A patient with                            mild systemic disease. After reviewing the risks                            and benefits, the patient was deemed in                            satisfactory condition to undergo the procedure.  After obtaining informed consent, the colonoscope                            was passed under direct vision. Throughout the                            procedure, the patient's blood pressure, pulse, and                            oxygen saturations were monitored continuously. The                            CF [id number] #[id number] was introduced through the anus                            and advanced to the the cecum,  identified by                            appendiceal orifice and ileocecal valve. The                            colonoscopy was performed without difficulty. The                            patient tolerated the procedure well. The quality                            of the bowel preparation was good. The ileocecal                            valve, appendiceal orifice, and rectum were                            photographed. Scope In: 10:35:46 AM Scope Out: 10:52:43 AM Scope Withdrawal Time: 0 hours 9 minutes 50 seconds  Total Procedure Duration: 0 hours 16 minutes 57 seconds  Findings:                 A 2 mm polyp was found in the sigmoid colon. The                            polyp was sessile. The polyp was removed with a                            cold snare. Resection and retrieval were complete.                           Multiple small and large-mouthed diverticula were                            found in the left colon.                           Internal hemorrhoids were found. The hemorrhoids  were small.                           The exam was otherwise without abnormality on                            direct and retroflexion views. Complications:            No immediate complications. Estimated blood loss:                            None. Estimated Blood Loss:     Estimated blood loss: none. Impression:               - One 2 mm polyp in the sigmoid colon, removed with                            a cold snare. Resected and retrieved.                           - Diverticulosis in the left colon.                           - Internal hemorrhoids.                           - The examination was otherwise normal on direct                            and retroflexion views. Recommendation:           - Patient has a contact number available for                            emergencies. The signs and symptoms of potential                            delayed  complications were discussed with the                            patient. Return to normal activities tomorrow.                            Written discharge instructions were provided to the                            patient.                           - Resume previous diet.                           - Continue present medications.                           - Await pathology results. Milus Banister, MD 01/01/2021 10:55:37 AM This report has been signed electronically.

## 2021-01-01 NOTE — Progress Notes (Signed)
PT taken to PACU. Monitors in place. VSS. Report given to RN. 

## 2021-01-01 NOTE — Progress Notes (Signed)
Called to room to assist during endoscopic procedure.  Patient ID and intended procedure confirmed with present staff. Received instructions for my participation in the procedure from the performing physician.  

## 2021-01-01 NOTE — Progress Notes (Signed)
Pt's states no medical or surgical changes since previsit or office visit.   VS taken by CW 

## 2021-01-01 NOTE — Patient Instructions (Signed)
Thank you for allowing [place] to care for you today!  Await final biopsy results, approximately 2 weeks.  Will make recommendation at that time for your next colonoscopy.  Resume previous diet and medications today.  May resume normal daily activities tomorrow, 01/02/21.   YOU HAD AN ENDOSCOPIC PROCEDURE TODAY AT [facility]:   Refer to the procedure report that was given to you for any specific questions about what was found during the examination.  If the procedure report does not answer your questions, please call your gastroenterologist to clarify.  If you requested that your care partner not be given the details of your procedure findings, then the procedure report has been included in a sealed envelope for you to review at your convenience later.  YOU SHOULD EXPECT: Some feelings of bloating in the abdomen. Passage of more gas than usual.  Walking can help get rid of the air that was put into your GI tract during the procedure and reduce the bloating. If you had a lower endoscopy (such as a colonoscopy or flexible sigmoidoscopy) you may notice spotting of blood in your stool or on the toilet paper. If you underwent a bowel prep for your procedure, you may not have a normal bowel movement for a few days.  Please Note:  You might notice some irritation and congestion in your nose or some drainage.  This is from the oxygen used during your procedure.  There is no need for concern and it should clear up in a day or so.  SYMPTOMS TO REPORT IMMEDIATELY:  Following lower endoscopy (colonoscopy or flexible sigmoidoscopy):  Excessive amounts of blood in the stool  Significant tenderness or worsening of abdominal pains  Swelling of the abdomen that is new, acute  Fever of 100F or higher   For urgent or emergent issues, a gastroenterologist can be reached at any hour by calling [phone]. Do not use MyChart messaging for urgent concerns.    DIET:  We do recommend a small meal  at first, but then you may proceed to your regular diet.  Drink plenty of fluids but you should avoid alcoholic beverages for 24 hours.  ACTIVITY:  You should plan to take it easy for the rest of today and you should NOT DRIVE or use heavy machinery until tomorrow (because of the sedation medicines used during the test).    FOLLOW UP: Our staff will call the number listed on your records 48-72 hours following your procedure to check on you and address any questions or concerns that you may have regarding the information given to you following your procedure. If we do not reach you, we will leave a message.  We will attempt to reach you two times.  During this call, we will ask if you have developed any symptoms of COVID 19. If you develop any symptoms (ie: fever, flu-like symptoms, shortness of breath, cough etc.) before then, please call [phone].  If you test positive for Covid 19 in the 2 weeks post procedure, please call and report this information to [place].    If any biopsies were taken you will be contacted by phone or by letter within the next 1-3 weeks.  Please call us at [phone] if you have not heard about the biopsies in 3 weeks.    SIGNATURES/CONFIDENTIALITY: You and/or your care partner have signed paperwork which will be entered into your electronic medical record.  These signatures attest to the fact that that the information above  on your After Visit Summary has been reviewed and is understood.  Full responsibility of the confidentiality of this discharge information lies with you and/or your care-partner.

## 2021-01-03 ENCOUNTER — Telehealth: Payer: Self-pay

## 2021-01-03 NOTE — Telephone Encounter (Signed)
  Follow up Call-  Call back number 01/01/2021  Post procedure Call Back phone  # [phone]  Permission to leave phone message Yes  Some recent data might be hidden     Patient questions:  Do you have a fever, pain , or abdominal swelling? No. Pain Score  0 *  Have you tolerated food without any problems? Yes.    Have you been able to return to your normal activities? Yes.    Do you have any questions about your discharge instructions: Diet   No. Medications  No. Follow up visit  No.  Do you have questions or concerns about your Care? No.  Actions: * If pain score is 4 or above: No action needed, pain <4.   Have you developed a fever since your procedure? No   2.   Have you had an respiratory symptoms (SOB or cough) since your procedure? No   3.   Have you tested positive for COVID 19 since your procedure no   4.   Have you had any family members/close contacts diagnosed with the COVID 19 since your procedure?  No    If yes to any of these questions please route to Joylene John, RN and Joella Prince, RN

## 2021-01-09 ENCOUNTER — Encounter: Payer: Self-pay | Admitting: Gastroenterology

## 2021-02-01 DIAGNOSIS — I10 Essential (primary) hypertension: Secondary | ICD-10-CM | POA: Diagnosis not present

## 2021-02-01 DIAGNOSIS — E782 Mixed hyperlipidemia: Secondary | ICD-10-CM | POA: Diagnosis not present

## 2021-02-01 DIAGNOSIS — E1169 Type 2 diabetes mellitus with other specified complication: Secondary | ICD-10-CM | POA: Diagnosis not present

## 2021-02-01 DIAGNOSIS — E7800 Pure hypercholesterolemia, unspecified: Secondary | ICD-10-CM | POA: Diagnosis not present

## 2021-02-01 DIAGNOSIS — E559 Vitamin D deficiency, unspecified: Secondary | ICD-10-CM | POA: Diagnosis not present

## 2021-02-01 DIAGNOSIS — R7309 Other abnormal glucose: Secondary | ICD-10-CM | POA: Diagnosis not present

## 2021-02-12 DIAGNOSIS — M1712 Unilateral primary osteoarthritis, left knee: Secondary | ICD-10-CM | POA: Diagnosis not present

## 2021-03-28 DIAGNOSIS — Z01411 Encounter for gynecological examination (general) (routine) with abnormal findings: Secondary | ICD-10-CM | POA: Diagnosis not present

## 2021-03-28 DIAGNOSIS — M8580 Other specified disorders of bone density and structure, unspecified site: Secondary | ICD-10-CM | POA: Diagnosis not present

## 2021-03-28 DIAGNOSIS — I10 Essential (primary) hypertension: Secondary | ICD-10-CM | POA: Diagnosis not present

## 2021-03-28 DIAGNOSIS — Z6831 Body mass index (BMI) 31.0-31.9, adult: Secondary | ICD-10-CM | POA: Diagnosis not present

## 2021-04-01 DIAGNOSIS — M8589 Other specified disorders of bone density and structure, multiple sites: Secondary | ICD-10-CM | POA: Diagnosis not present

## 2021-04-01 DIAGNOSIS — Z1231 Encounter for screening mammogram for malignant neoplasm of breast: Secondary | ICD-10-CM | POA: Diagnosis not present

## 2021-06-04 DIAGNOSIS — Z6830 Body mass index (BMI) 30.0-30.9, adult: Secondary | ICD-10-CM | POA: Diagnosis not present

## 2021-06-04 DIAGNOSIS — I10 Essential (primary) hypertension: Secondary | ICD-10-CM | POA: Diagnosis not present

## 2021-06-04 DIAGNOSIS — R7309 Other abnormal glucose: Secondary | ICD-10-CM | POA: Diagnosis not present

## 2021-06-04 DIAGNOSIS — M130 Polyarthritis, unspecified: Secondary | ICD-10-CM | POA: Diagnosis not present

## 2021-08-04 DIAGNOSIS — Z20822 Contact with and (suspected) exposure to covid-19: Secondary | ICD-10-CM | POA: Diagnosis not present

## 2021-08-27 DIAGNOSIS — M170 Bilateral primary osteoarthritis of knee: Secondary | ICD-10-CM | POA: Diagnosis not present

## 2021-09-03 DIAGNOSIS — M170 Bilateral primary osteoarthritis of knee: Secondary | ICD-10-CM | POA: Diagnosis not present

## 2021-10-17 DIAGNOSIS — E1169 Type 2 diabetes mellitus with other specified complication: Secondary | ICD-10-CM | POA: Diagnosis not present

## 2021-10-17 DIAGNOSIS — M130 Polyarthritis, unspecified: Secondary | ICD-10-CM | POA: Diagnosis not present

## 2021-10-17 DIAGNOSIS — E782 Mixed hyperlipidemia: Secondary | ICD-10-CM | POA: Diagnosis not present

## 2021-10-17 DIAGNOSIS — J301 Allergic rhinitis due to pollen: Secondary | ICD-10-CM | POA: Diagnosis not present

## 2021-10-17 DIAGNOSIS — Z6829 Body mass index (BMI) 29.0-29.9, adult: Secondary | ICD-10-CM | POA: Diagnosis not present

## 2021-10-17 DIAGNOSIS — M1009 Idiopathic gout, multiple sites: Secondary | ICD-10-CM | POA: Diagnosis not present

## 2021-10-17 DIAGNOSIS — E785 Hyperlipidemia, unspecified: Secondary | ICD-10-CM | POA: Diagnosis not present

## 2021-10-31 DIAGNOSIS — M170 Bilateral primary osteoarthritis of knee: Secondary | ICD-10-CM | POA: Diagnosis not present

## 2021-10-31 DIAGNOSIS — M1711 Unilateral primary osteoarthritis, right knee: Secondary | ICD-10-CM | POA: Diagnosis not present

## 2021-11-28 DIAGNOSIS — I10 Essential (primary) hypertension: Secondary | ICD-10-CM | POA: Diagnosis not present

## 2021-12-03 NOTE — Patient Outreach (Signed)
Received a Health Coach referral notification for Ms. Howdyshell. I have assigned Johny Shock RN to call for follow up and determine if there are any Case Management needs.    [facility], [place], [place] [facility] [phone]

## 2021-12-04 ENCOUNTER — Other Ambulatory Visit: Payer: Self-pay | Admitting: *Deleted

## 2021-12-04 NOTE — Patient Outreach (Signed)
[facility]) Care Management  12/04/2021  Jill Lawrence 09/08/1945 [id number]   RN Health Coach telephone call to patient.  [employer] compliance verified. Per patient she is doing well and had just got in from water aerobics. RN discussed RN services, Radio producer and Pharmacy services. Patient declined the services. Patient did agree to a 6 month educational mailing. Patient will call if she has questions or need any other services at a later date.  Plan: RN will send next 6 month mailing in November 2023.  [place] [facility] [phone]

## 2021-12-24 ENCOUNTER — Other Ambulatory Visit: Payer: Self-pay | Admitting: *Deleted

## 2021-12-24 NOTE — Patient Outreach (Signed)
[facility]) Care Management  12/24/2021  LUANE ROCHON 25-Aug-1945 [id number]   [facility] closed patient's case. A case closed letter sent patient.  [facility] has discontinued the educational six month mailing program.   Plan:  Letter sent to patient   [place] Management [phone]

## 2022-01-13 DIAGNOSIS — M1711 Unilateral primary osteoarthritis, right knee: Secondary | ICD-10-CM | POA: Diagnosis not present

## 2022-01-13 DIAGNOSIS — M1712 Unilateral primary osteoarthritis, left knee: Secondary | ICD-10-CM | POA: Diagnosis not present

## 2022-03-24 DIAGNOSIS — M170 Bilateral primary osteoarthritis of knee: Secondary | ICD-10-CM | POA: Diagnosis not present

## 2022-05-12 ENCOUNTER — Ambulatory Visit: Payer: Self-pay | Admitting: *Deleted

## 2022-06-10 DIAGNOSIS — R7303 Prediabetes: Secondary | ICD-10-CM | POA: Diagnosis not present

## 2022-06-10 DIAGNOSIS — E559 Vitamin D deficiency, unspecified: Secondary | ICD-10-CM | POA: Diagnosis not present

## 2022-06-10 DIAGNOSIS — I10 Essential (primary) hypertension: Secondary | ICD-10-CM | POA: Diagnosis not present

## 2022-06-10 DIAGNOSIS — E785 Hyperlipidemia, unspecified: Secondary | ICD-10-CM | POA: Diagnosis not present

## 2022-06-24 DIAGNOSIS — Z01411 Encounter for gynecological examination (general) (routine) with abnormal findings: Secondary | ICD-10-CM | POA: Diagnosis not present

## 2022-06-24 DIAGNOSIS — Z6832 Body mass index (BMI) 32.0-32.9, adult: Secondary | ICD-10-CM | POA: Diagnosis not present

## 2022-06-24 DIAGNOSIS — Z1231 Encounter for screening mammogram for malignant neoplasm of breast: Secondary | ICD-10-CM | POA: Diagnosis not present

## 2022-06-24 DIAGNOSIS — L0292 Furuncle, unspecified: Secondary | ICD-10-CM | POA: Diagnosis not present

## 2022-08-19 DIAGNOSIS — M170 Bilateral primary osteoarthritis of knee: Secondary | ICD-10-CM | POA: Diagnosis not present

## 2022-08-26 DIAGNOSIS — M170 Bilateral primary osteoarthritis of knee: Secondary | ICD-10-CM | POA: Diagnosis not present

## 2022-09-02 DIAGNOSIS — M170 Bilateral primary osteoarthritis of knee: Secondary | ICD-10-CM | POA: Diagnosis not present

## 2022-10-07 DIAGNOSIS — I10 Essential (primary) hypertension: Secondary | ICD-10-CM | POA: Diagnosis not present

## 2022-10-07 DIAGNOSIS — M130 Polyarthritis, unspecified: Secondary | ICD-10-CM | POA: Diagnosis not present

## 2022-10-07 DIAGNOSIS — E782 Mixed hyperlipidemia: Secondary | ICD-10-CM | POA: Diagnosis not present

## 2022-10-07 DIAGNOSIS — E1169 Type 2 diabetes mellitus with other specified complication: Secondary | ICD-10-CM | POA: Diagnosis not present

## 2022-10-21 DIAGNOSIS — M170 Bilateral primary osteoarthritis of knee: Secondary | ICD-10-CM | POA: Diagnosis not present

## 2023-02-27 DIAGNOSIS — E559 Vitamin D deficiency, unspecified: Secondary | ICD-10-CM | POA: Diagnosis not present

## 2023-02-27 DIAGNOSIS — E668 Other obesity: Secondary | ICD-10-CM | POA: Diagnosis not present

## 2023-02-27 DIAGNOSIS — E785 Hyperlipidemia, unspecified: Secondary | ICD-10-CM | POA: Diagnosis not present

## 2023-02-27 DIAGNOSIS — M130 Polyarthritis, unspecified: Secondary | ICD-10-CM | POA: Diagnosis not present

## 2023-02-27 DIAGNOSIS — E1169 Type 2 diabetes mellitus with other specified complication: Secondary | ICD-10-CM | POA: Diagnosis not present

## 2023-02-27 DIAGNOSIS — I10 Essential (primary) hypertension: Secondary | ICD-10-CM | POA: Diagnosis not present

## 2023-02-27 DIAGNOSIS — Z6829 Body mass index (BMI) 29.0-29.9, adult: Secondary | ICD-10-CM | POA: Diagnosis not present

## 2023-03-31 DIAGNOSIS — M170 Bilateral primary osteoarthritis of knee: Secondary | ICD-10-CM | POA: Diagnosis not present

## 2023-04-07 DIAGNOSIS — M170 Bilateral primary osteoarthritis of knee: Secondary | ICD-10-CM | POA: Diagnosis not present

## 2023-04-08 DIAGNOSIS — H04123 Dry eye syndrome of bilateral lacrimal glands: Secondary | ICD-10-CM | POA: Diagnosis not present

## 2023-04-08 DIAGNOSIS — H43813 Vitreous degeneration, bilateral: Secondary | ICD-10-CM | POA: Diagnosis not present

## 2023-04-08 DIAGNOSIS — H2513 Age-related nuclear cataract, bilateral: Secondary | ICD-10-CM | POA: Diagnosis not present

## 2023-04-14 DIAGNOSIS — M170 Bilateral primary osteoarthritis of knee: Secondary | ICD-10-CM | POA: Diagnosis not present

## 2023-04-16 DIAGNOSIS — M533 Sacrococcygeal disorders, not elsewhere classified: Secondary | ICD-10-CM | POA: Diagnosis not present

## 2023-04-16 DIAGNOSIS — M545 Low back pain, unspecified: Secondary | ICD-10-CM | POA: Diagnosis not present

## 2023-05-19 DIAGNOSIS — M5416 Radiculopathy, lumbar region: Secondary | ICD-10-CM | POA: Diagnosis not present

## 2023-05-19 DIAGNOSIS — M47896 Other spondylosis, lumbar region: Secondary | ICD-10-CM | POA: Diagnosis not present

## 2023-05-21 DIAGNOSIS — M5416 Radiculopathy, lumbar region: Secondary | ICD-10-CM | POA: Diagnosis not present

## 2023-05-26 DIAGNOSIS — M5416 Radiculopathy, lumbar region: Secondary | ICD-10-CM | POA: Diagnosis not present

## 2023-05-28 DIAGNOSIS — M545 Low back pain, unspecified: Secondary | ICD-10-CM | POA: Diagnosis not present

## 2023-05-28 DIAGNOSIS — M533 Sacrococcygeal disorders, not elsewhere classified: Secondary | ICD-10-CM | POA: Diagnosis not present

## 2023-05-29 DIAGNOSIS — M5416 Radiculopathy, lumbar region: Secondary | ICD-10-CM | POA: Diagnosis not present

## 2023-06-11 DIAGNOSIS — M533 Sacrococcygeal disorders, not elsewhere classified: Secondary | ICD-10-CM | POA: Diagnosis not present

## 2023-06-12 DIAGNOSIS — M5416 Radiculopathy, lumbar region: Secondary | ICD-10-CM | POA: Diagnosis not present

## 2023-06-16 DIAGNOSIS — M5416 Radiculopathy, lumbar region: Secondary | ICD-10-CM | POA: Diagnosis not present

## 2023-06-23 DIAGNOSIS — M47896 Other spondylosis, lumbar region: Secondary | ICD-10-CM | POA: Diagnosis not present

## 2023-06-23 DIAGNOSIS — M5416 Radiculopathy, lumbar region: Secondary | ICD-10-CM | POA: Diagnosis not present

## 2023-06-25 DIAGNOSIS — M533 Sacrococcygeal disorders, not elsewhere classified: Secondary | ICD-10-CM | POA: Diagnosis not present

## 2023-06-25 DIAGNOSIS — M47896 Other spondylosis, lumbar region: Secondary | ICD-10-CM | POA: Diagnosis not present

## 2023-07-21 DIAGNOSIS — M545 Low back pain, unspecified: Secondary | ICD-10-CM | POA: Diagnosis not present

## 2023-07-24 DIAGNOSIS — M533 Sacrococcygeal disorders, not elsewhere classified: Secondary | ICD-10-CM | POA: Diagnosis not present

## 2023-07-24 DIAGNOSIS — M47816 Spondylosis without myelopathy or radiculopathy, lumbar region: Secondary | ICD-10-CM | POA: Diagnosis not present

## 2023-08-04 ENCOUNTER — Other Ambulatory Visit: Payer: Self-pay | Admitting: Obstetrics & Gynecology

## 2023-08-04 DIAGNOSIS — M5431 Sciatica, right side: Secondary | ICD-10-CM | POA: Diagnosis not present

## 2023-08-04 DIAGNOSIS — Z01411 Encounter for gynecological examination (general) (routine) with abnormal findings: Secondary | ICD-10-CM | POA: Diagnosis not present

## 2023-08-04 DIAGNOSIS — M8580 Other specified disorders of bone density and structure, unspecified site: Secondary | ICD-10-CM

## 2023-08-04 DIAGNOSIS — Z1231 Encounter for screening mammogram for malignant neoplasm of breast: Secondary | ICD-10-CM | POA: Diagnosis not present

## 2023-10-06 DIAGNOSIS — M170 Bilateral primary osteoarthritis of knee: Secondary | ICD-10-CM | POA: Diagnosis not present

## 2023-10-07 DIAGNOSIS — H04123 Dry eye syndrome of bilateral lacrimal glands: Secondary | ICD-10-CM | POA: Diagnosis not present

## 2023-10-07 DIAGNOSIS — H2513 Age-related nuclear cataract, bilateral: Secondary | ICD-10-CM | POA: Diagnosis not present

## 2023-10-07 DIAGNOSIS — H43813 Vitreous degeneration, bilateral: Secondary | ICD-10-CM | POA: Diagnosis not present

## 2023-11-06 DIAGNOSIS — M4807 Spinal stenosis, lumbosacral region: Secondary | ICD-10-CM | POA: Diagnosis not present

## 2023-11-06 DIAGNOSIS — M47896 Other spondylosis, lumbar region: Secondary | ICD-10-CM | POA: Diagnosis not present

## 2023-11-06 DIAGNOSIS — M545 Low back pain, unspecified: Secondary | ICD-10-CM | POA: Diagnosis not present

## 2023-11-09 DIAGNOSIS — M170 Bilateral primary osteoarthritis of knee: Secondary | ICD-10-CM | POA: Diagnosis not present

## 2023-11-16 DIAGNOSIS — M170 Bilateral primary osteoarthritis of knee: Secondary | ICD-10-CM | POA: Diagnosis not present

## 2023-11-17 DIAGNOSIS — M48062 Spinal stenosis, lumbar region with neurogenic claudication: Secondary | ICD-10-CM | POA: Diagnosis not present

## 2023-11-17 DIAGNOSIS — E782 Mixed hyperlipidemia: Secondary | ICD-10-CM | POA: Diagnosis not present

## 2023-11-17 DIAGNOSIS — I10 Essential (primary) hypertension: Secondary | ICD-10-CM | POA: Diagnosis not present

## 2023-11-17 DIAGNOSIS — E1169 Type 2 diabetes mellitus with other specified complication: Secondary | ICD-10-CM | POA: Diagnosis not present

## 2023-11-17 DIAGNOSIS — M130 Polyarthritis, unspecified: Secondary | ICD-10-CM | POA: Diagnosis not present

## 2023-11-23 DIAGNOSIS — M170 Bilateral primary osteoarthritis of knee: Secondary | ICD-10-CM | POA: Diagnosis not present

## 2023-12-01 DIAGNOSIS — M5416 Radiculopathy, lumbar region: Secondary | ICD-10-CM | POA: Diagnosis not present

## 2023-12-17 DIAGNOSIS — M545 Low back pain, unspecified: Secondary | ICD-10-CM | POA: Diagnosis not present

## 2023-12-17 DIAGNOSIS — M4807 Spinal stenosis, lumbosacral region: Secondary | ICD-10-CM | POA: Diagnosis not present

## 2023-12-17 DIAGNOSIS — M533 Sacrococcygeal disorders, not elsewhere classified: Secondary | ICD-10-CM | POA: Diagnosis not present

## 2023-12-17 DIAGNOSIS — M47896 Other spondylosis, lumbar region: Secondary | ICD-10-CM | POA: Diagnosis not present

## 2023-12-31 DIAGNOSIS — E1165 Type 2 diabetes mellitus with hyperglycemia: Secondary | ICD-10-CM | POA: Diagnosis not present

## 2023-12-31 DIAGNOSIS — E785 Hyperlipidemia, unspecified: Secondary | ICD-10-CM | POA: Diagnosis not present

## 2023-12-31 DIAGNOSIS — E782 Mixed hyperlipidemia: Secondary | ICD-10-CM | POA: Diagnosis not present

## 2023-12-31 DIAGNOSIS — M48062 Spinal stenosis, lumbar region with neurogenic claudication: Secondary | ICD-10-CM | POA: Diagnosis not present

## 2023-12-31 DIAGNOSIS — I10 Essential (primary) hypertension: Secondary | ICD-10-CM | POA: Diagnosis not present

## 2023-12-31 DIAGNOSIS — J399 Disease of upper respiratory tract, unspecified: Secondary | ICD-10-CM | POA: Diagnosis not present

## 2024-01-18 DIAGNOSIS — M6281 Muscle weakness (generalized): Secondary | ICD-10-CM | POA: Diagnosis not present

## 2024-01-18 DIAGNOSIS — M48061 Spinal stenosis, lumbar region without neurogenic claudication: Secondary | ICD-10-CM | POA: Diagnosis not present

## 2024-01-18 DIAGNOSIS — M170 Bilateral primary osteoarthritis of knee: Secondary | ICD-10-CM | POA: Diagnosis not present

## 2024-01-19 DIAGNOSIS — M48061 Spinal stenosis, lumbar region without neurogenic claudication: Secondary | ICD-10-CM | POA: Diagnosis not present

## 2024-01-19 DIAGNOSIS — M6281 Muscle weakness (generalized): Secondary | ICD-10-CM | POA: Diagnosis not present

## 2024-01-19 DIAGNOSIS — M170 Bilateral primary osteoarthritis of knee: Secondary | ICD-10-CM | POA: Diagnosis not present

## 2024-01-25 DIAGNOSIS — M48061 Spinal stenosis, lumbar region without neurogenic claudication: Secondary | ICD-10-CM | POA: Diagnosis not present

## 2024-01-25 DIAGNOSIS — M170 Bilateral primary osteoarthritis of knee: Secondary | ICD-10-CM | POA: Diagnosis not present

## 2024-01-25 DIAGNOSIS — M6281 Muscle weakness (generalized): Secondary | ICD-10-CM | POA: Diagnosis not present

## 2024-02-01 DIAGNOSIS — M6281 Muscle weakness (generalized): Secondary | ICD-10-CM | POA: Diagnosis not present

## 2024-02-01 DIAGNOSIS — M48061 Spinal stenosis, lumbar region without neurogenic claudication: Secondary | ICD-10-CM | POA: Diagnosis not present

## 2024-02-01 DIAGNOSIS — M170 Bilateral primary osteoarthritis of knee: Secondary | ICD-10-CM | POA: Diagnosis not present

## 2024-02-05 DIAGNOSIS — M48061 Spinal stenosis, lumbar region without neurogenic claudication: Secondary | ICD-10-CM | POA: Diagnosis not present

## 2024-02-05 DIAGNOSIS — M1611 Unilateral primary osteoarthritis, right hip: Secondary | ICD-10-CM | POA: Diagnosis not present

## 2024-02-05 DIAGNOSIS — M170 Bilateral primary osteoarthritis of knee: Secondary | ICD-10-CM | POA: Diagnosis not present

## 2024-02-05 DIAGNOSIS — M6281 Muscle weakness (generalized): Secondary | ICD-10-CM | POA: Diagnosis not present

## 2024-02-08 DIAGNOSIS — M48061 Spinal stenosis, lumbar region without neurogenic claudication: Secondary | ICD-10-CM | POA: Diagnosis not present

## 2024-02-08 DIAGNOSIS — M6281 Muscle weakness (generalized): Secondary | ICD-10-CM | POA: Diagnosis not present

## 2024-02-08 DIAGNOSIS — M170 Bilateral primary osteoarthritis of knee: Secondary | ICD-10-CM | POA: Diagnosis not present

## 2024-02-10 ENCOUNTER — Other Ambulatory Visit (HOSPITAL_BASED_OUTPATIENT_CLINIC_OR_DEPARTMENT_OTHER)

## 2024-02-12 DIAGNOSIS — M170 Bilateral primary osteoarthritis of knee: Secondary | ICD-10-CM | POA: Diagnosis not present

## 2024-02-12 DIAGNOSIS — M48061 Spinal stenosis, lumbar region without neurogenic claudication: Secondary | ICD-10-CM | POA: Diagnosis not present

## 2024-02-12 DIAGNOSIS — M6281 Muscle weakness (generalized): Secondary | ICD-10-CM | POA: Diagnosis not present

## 2024-02-15 DIAGNOSIS — M170 Bilateral primary osteoarthritis of knee: Secondary | ICD-10-CM | POA: Diagnosis not present

## 2024-02-15 DIAGNOSIS — M6281 Muscle weakness (generalized): Secondary | ICD-10-CM | POA: Diagnosis not present

## 2024-02-15 DIAGNOSIS — M48061 Spinal stenosis, lumbar region without neurogenic claudication: Secondary | ICD-10-CM | POA: Diagnosis not present

## 2024-02-19 DIAGNOSIS — M170 Bilateral primary osteoarthritis of knee: Secondary | ICD-10-CM | POA: Diagnosis not present

## 2024-02-19 DIAGNOSIS — M6281 Muscle weakness (generalized): Secondary | ICD-10-CM | POA: Diagnosis not present

## 2024-02-19 DIAGNOSIS — M48061 Spinal stenosis, lumbar region without neurogenic claudication: Secondary | ICD-10-CM | POA: Diagnosis not present

## 2024-02-23 DIAGNOSIS — E782 Mixed hyperlipidemia: Secondary | ICD-10-CM | POA: Diagnosis not present

## 2024-02-23 DIAGNOSIS — M130 Polyarthritis, unspecified: Secondary | ICD-10-CM | POA: Diagnosis not present

## 2024-02-23 DIAGNOSIS — Z6828 Body mass index (BMI) 28.0-28.9, adult: Secondary | ICD-10-CM | POA: Diagnosis not present

## 2024-02-23 DIAGNOSIS — I10 Essential (primary) hypertension: Secondary | ICD-10-CM | POA: Diagnosis not present

## 2024-02-23 DIAGNOSIS — M1611 Unilateral primary osteoarthritis, right hip: Secondary | ICD-10-CM | POA: Diagnosis not present

## 2024-02-23 DIAGNOSIS — R7303 Prediabetes: Secondary | ICD-10-CM | POA: Diagnosis not present

## 2024-02-24 DIAGNOSIS — M6281 Muscle weakness (generalized): Secondary | ICD-10-CM | POA: Diagnosis not present

## 2024-02-24 DIAGNOSIS — M48061 Spinal stenosis, lumbar region without neurogenic claudication: Secondary | ICD-10-CM | POA: Diagnosis not present

## 2024-02-24 DIAGNOSIS — M170 Bilateral primary osteoarthritis of knee: Secondary | ICD-10-CM | POA: Diagnosis not present

## 2024-02-26 DIAGNOSIS — M6281 Muscle weakness (generalized): Secondary | ICD-10-CM | POA: Diagnosis not present

## 2024-02-26 DIAGNOSIS — M170 Bilateral primary osteoarthritis of knee: Secondary | ICD-10-CM | POA: Diagnosis not present

## 2024-02-26 DIAGNOSIS — M48061 Spinal stenosis, lumbar region without neurogenic claudication: Secondary | ICD-10-CM | POA: Diagnosis not present

## 2024-02-29 DIAGNOSIS — M6281 Muscle weakness (generalized): Secondary | ICD-10-CM | POA: Diagnosis not present

## 2024-02-29 DIAGNOSIS — M170 Bilateral primary osteoarthritis of knee: Secondary | ICD-10-CM | POA: Diagnosis not present

## 2024-02-29 DIAGNOSIS — M48061 Spinal stenosis, lumbar region without neurogenic claudication: Secondary | ICD-10-CM | POA: Diagnosis not present

## 2024-03-04 DIAGNOSIS — M6281 Muscle weakness (generalized): Secondary | ICD-10-CM | POA: Diagnosis not present

## 2024-03-04 DIAGNOSIS — M170 Bilateral primary osteoarthritis of knee: Secondary | ICD-10-CM | POA: Diagnosis not present

## 2024-03-04 DIAGNOSIS — M48061 Spinal stenosis, lumbar region without neurogenic claudication: Secondary | ICD-10-CM | POA: Diagnosis not present

## 2024-03-16 DIAGNOSIS — M6281 Muscle weakness (generalized): Secondary | ICD-10-CM | POA: Diagnosis not present

## 2024-03-16 DIAGNOSIS — M48061 Spinal stenosis, lumbar region without neurogenic claudication: Secondary | ICD-10-CM | POA: Diagnosis not present

## 2024-03-16 DIAGNOSIS — M170 Bilateral primary osteoarthritis of knee: Secondary | ICD-10-CM | POA: Diagnosis not present

## 2024-03-18 DIAGNOSIS — M6281 Muscle weakness (generalized): Secondary | ICD-10-CM | POA: Diagnosis not present

## 2024-03-18 DIAGNOSIS — M170 Bilateral primary osteoarthritis of knee: Secondary | ICD-10-CM | POA: Diagnosis not present

## 2024-03-18 DIAGNOSIS — M48061 Spinal stenosis, lumbar region without neurogenic claudication: Secondary | ICD-10-CM | POA: Diagnosis not present

## 2024-03-23 DIAGNOSIS — M48061 Spinal stenosis, lumbar region without neurogenic claudication: Secondary | ICD-10-CM | POA: Diagnosis not present

## 2024-03-23 DIAGNOSIS — M6281 Muscle weakness (generalized): Secondary | ICD-10-CM | POA: Diagnosis not present

## 2024-03-23 DIAGNOSIS — M170 Bilateral primary osteoarthritis of knee: Secondary | ICD-10-CM | POA: Diagnosis not present

## 2024-03-25 DIAGNOSIS — M48061 Spinal stenosis, lumbar region without neurogenic claudication: Secondary | ICD-10-CM | POA: Diagnosis not present

## 2024-03-25 DIAGNOSIS — M170 Bilateral primary osteoarthritis of knee: Secondary | ICD-10-CM | POA: Diagnosis not present

## 2024-03-25 DIAGNOSIS — M6281 Muscle weakness (generalized): Secondary | ICD-10-CM | POA: Diagnosis not present

## 2024-03-28 DIAGNOSIS — M6281 Muscle weakness (generalized): Secondary | ICD-10-CM | POA: Diagnosis not present

## 2024-03-28 DIAGNOSIS — M48061 Spinal stenosis, lumbar region without neurogenic claudication: Secondary | ICD-10-CM | POA: Diagnosis not present

## 2024-03-28 DIAGNOSIS — M170 Bilateral primary osteoarthritis of knee: Secondary | ICD-10-CM | POA: Diagnosis not present

## 2024-03-30 DIAGNOSIS — M170 Bilateral primary osteoarthritis of knee: Secondary | ICD-10-CM | POA: Diagnosis not present

## 2024-03-30 DIAGNOSIS — M6281 Muscle weakness (generalized): Secondary | ICD-10-CM | POA: Diagnosis not present

## 2024-03-30 DIAGNOSIS — M48061 Spinal stenosis, lumbar region without neurogenic claudication: Secondary | ICD-10-CM | POA: Diagnosis not present

## 2024-04-06 DIAGNOSIS — M48061 Spinal stenosis, lumbar region without neurogenic claudication: Secondary | ICD-10-CM | POA: Diagnosis not present

## 2024-04-06 DIAGNOSIS — M170 Bilateral primary osteoarthritis of knee: Secondary | ICD-10-CM | POA: Diagnosis not present

## 2024-04-06 DIAGNOSIS — M6281 Muscle weakness (generalized): Secondary | ICD-10-CM | POA: Diagnosis not present

## 2024-04-13 ENCOUNTER — Other Ambulatory Visit: Payer: Medicare Other
